# Patient Record
Sex: Female | Born: 1960 | Race: Black or African American | Hispanic: No | Marital: Single | Smoking: Never smoker
Health system: Southern US, Community
[De-identification: ages and names within clinical notes are randomized; demographics above are authoritative.]

## PROBLEM LIST (undated history)

## (undated) DIAGNOSIS — D869 Sarcoidosis, unspecified: Secondary | ICD-10-CM

## (undated) DIAGNOSIS — I4891 Unspecified atrial fibrillation: Secondary | ICD-10-CM

## (undated) DIAGNOSIS — G4733 Obstructive sleep apnea (adult) (pediatric): Secondary | ICD-10-CM

## (undated) HISTORY — PX: ABDOMINAL HYSTERECTOMY: SHX81

## (undated) HISTORY — PX: BREAST FIBROADENOMA SURGERY: SHX580

## (undated) HISTORY — DX: Obstructive sleep apnea (adult) (pediatric): G47.33

---

## 1999-10-04 ENCOUNTER — Encounter: Payer: Self-pay | Admitting: Family Medicine

## 1999-10-04 ENCOUNTER — Encounter: Admission: RE | Admit: 1999-10-04 | Discharge: 1999-10-04 | Payer: Self-pay | Admitting: Family Medicine

## 1999-10-05 ENCOUNTER — Encounter: Payer: Self-pay | Admitting: Family Medicine

## 1999-10-05 ENCOUNTER — Encounter: Admission: RE | Admit: 1999-10-05 | Discharge: 1999-10-05 | Payer: Self-pay | Admitting: Family Medicine

## 1999-12-01 ENCOUNTER — Ambulatory Visit: Admission: RE | Admit: 1999-12-01 | Discharge: 1999-12-01 | Payer: Self-pay | Admitting: Interventional Cardiology

## 1999-12-07 ENCOUNTER — Ambulatory Visit (HOSPITAL_COMMUNITY): Admission: RE | Admit: 1999-12-07 | Discharge: 1999-12-07 | Payer: Self-pay | Admitting: Pulmonary Disease

## 2000-03-15 ENCOUNTER — Other Ambulatory Visit: Admission: RE | Admit: 2000-03-15 | Discharge: 2000-03-15 | Payer: Self-pay | Admitting: Obstetrics and Gynecology

## 2000-05-23 ENCOUNTER — Inpatient Hospital Stay (HOSPITAL_COMMUNITY): Admission: AD | Admit: 2000-05-23 | Discharge: 2000-05-23 | Payer: Self-pay | Admitting: Obstetrics and Gynecology

## 2000-09-29 ENCOUNTER — Inpatient Hospital Stay (HOSPITAL_COMMUNITY): Admission: AD | Admit: 2000-09-29 | Discharge: 2000-10-02 | Payer: Self-pay | Admitting: Obstetrics and Gynecology

## 2000-09-29 ENCOUNTER — Inpatient Hospital Stay (HOSPITAL_COMMUNITY): Admission: AD | Admit: 2000-09-29 | Discharge: 2000-09-29 | Payer: Self-pay | Admitting: Obstetrics and Gynecology

## 2000-11-08 ENCOUNTER — Encounter: Admission: RE | Admit: 2000-11-08 | Discharge: 2001-02-06 | Payer: Self-pay | Admitting: Obstetrics and Gynecology

## 2000-12-21 ENCOUNTER — Other Ambulatory Visit: Admission: RE | Admit: 2000-12-21 | Discharge: 2000-12-21 | Payer: Self-pay | Admitting: Obstetrics and Gynecology

## 2000-12-21 ENCOUNTER — Encounter (INDEPENDENT_AMBULATORY_CARE_PROVIDER_SITE_OTHER): Payer: Self-pay

## 2001-05-28 ENCOUNTER — Other Ambulatory Visit: Admission: RE | Admit: 2001-05-28 | Discharge: 2001-05-28 | Payer: Self-pay | Admitting: Obstetrics and Gynecology

## 2001-11-13 ENCOUNTER — Other Ambulatory Visit: Admission: RE | Admit: 2001-11-13 | Discharge: 2001-11-13 | Payer: Self-pay | Admitting: Obstetrics and Gynecology

## 2003-01-03 ENCOUNTER — Other Ambulatory Visit: Admission: RE | Admit: 2003-01-03 | Discharge: 2003-01-03 | Payer: Self-pay | Admitting: Obstetrics and Gynecology

## 2004-01-30 ENCOUNTER — Other Ambulatory Visit: Admission: RE | Admit: 2004-01-30 | Discharge: 2004-01-30 | Payer: Self-pay | Admitting: Obstetrics and Gynecology

## 2005-02-24 ENCOUNTER — Encounter: Admission: RE | Admit: 2005-02-24 | Discharge: 2005-02-24 | Payer: Self-pay | Admitting: Family Medicine

## 2005-02-24 IMAGING — CT CT PELVIS W/O CM
1 series · 15 of 32 positions shown, 19 images · IV contrast (agent unspecified)
Comparison: none

CLINICAL DATA: Left flank pain.  History of renal calculi.  

ABDOMINAL CT ? NO CONTRAST:

[Series 2: renal stone · axial · 0.62mm/px · z∈[-364,-84]mm · 15 of 64 slices shown, 19 images]
[im 5/64  soft-tissue]
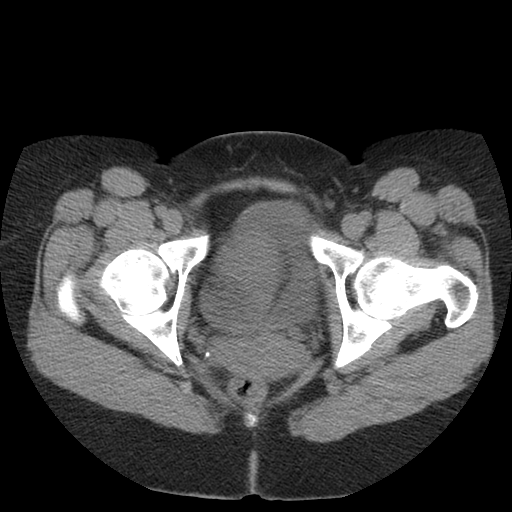
[im 5/64  bone]
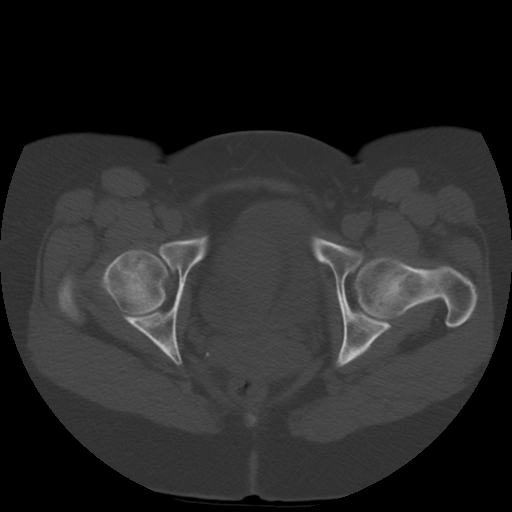
[im 9/64  soft-tissue]
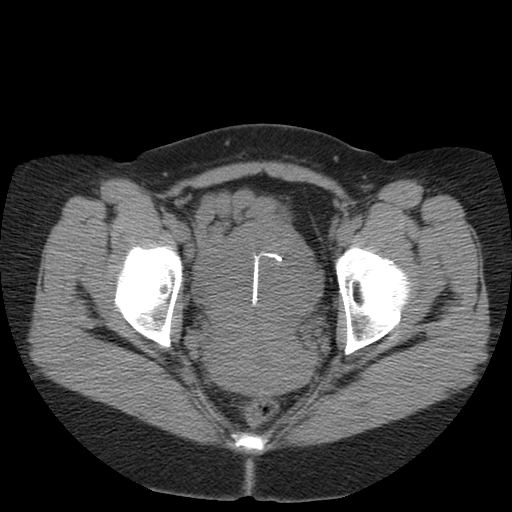
[im 13/64  soft-tissue]
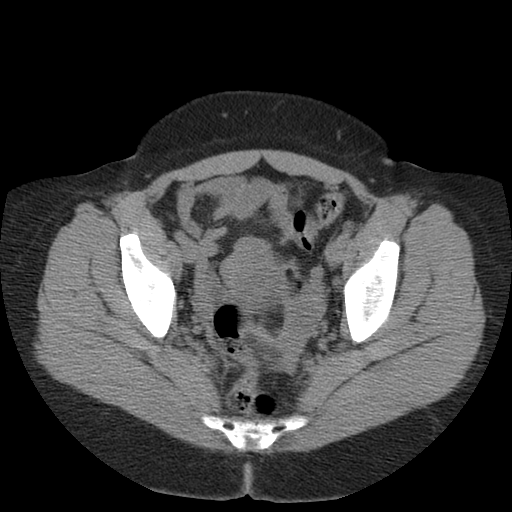
[im 19/64  soft-tissue]
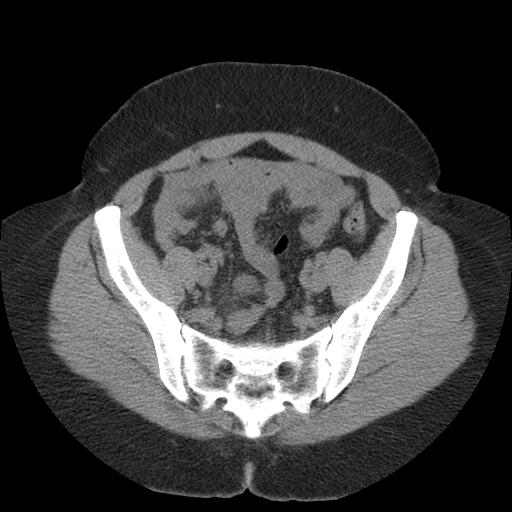
[im 23/64  soft-tissue]
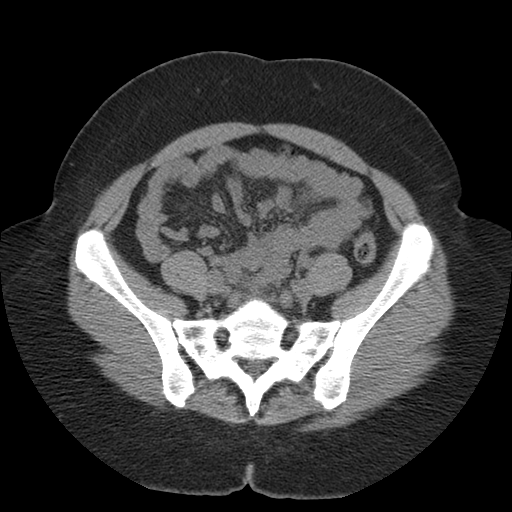
[im 27/64  soft-tissue]
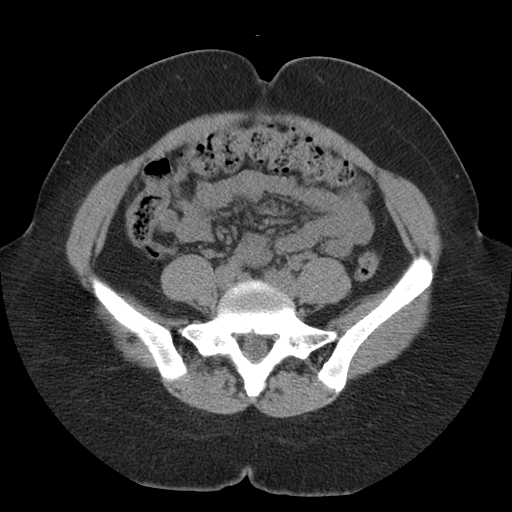
[im 33/64  soft-tissue]
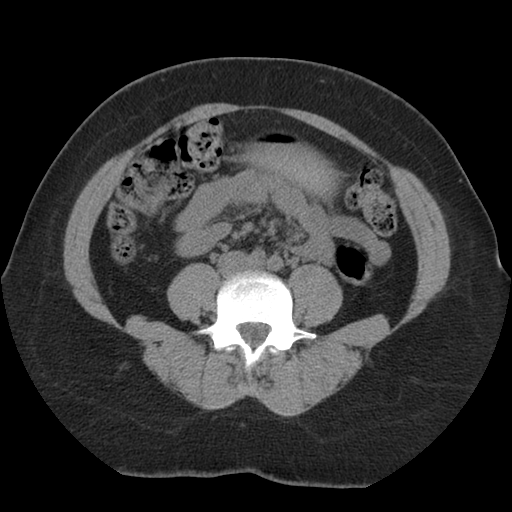
[im 37/64  soft-tissue]
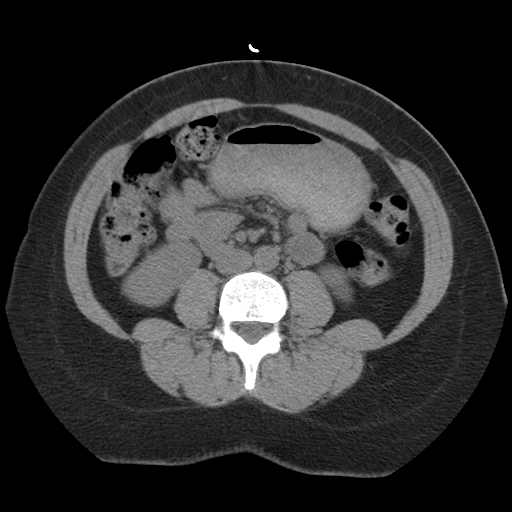
[im 41/64  soft-tissue]
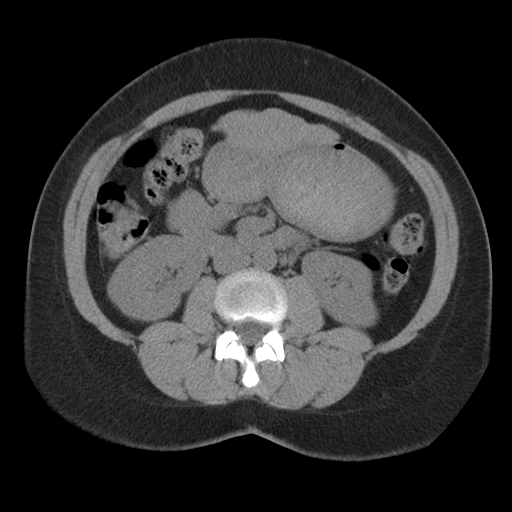
[im 41/64  bone]
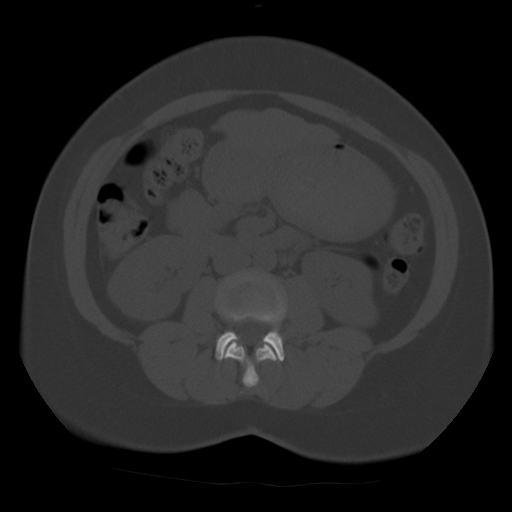
[im 45/64  soft-tissue]
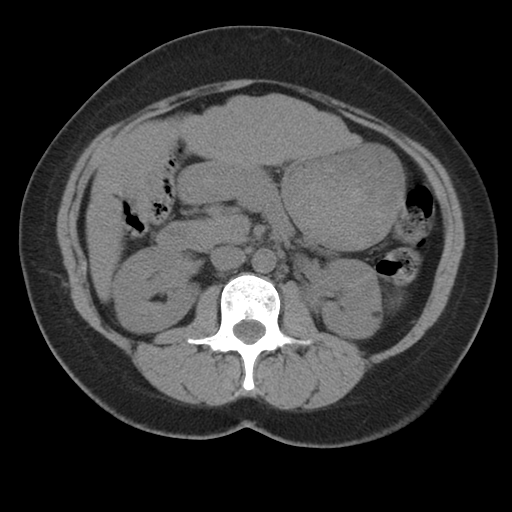
[im 51/64  soft-tissue]
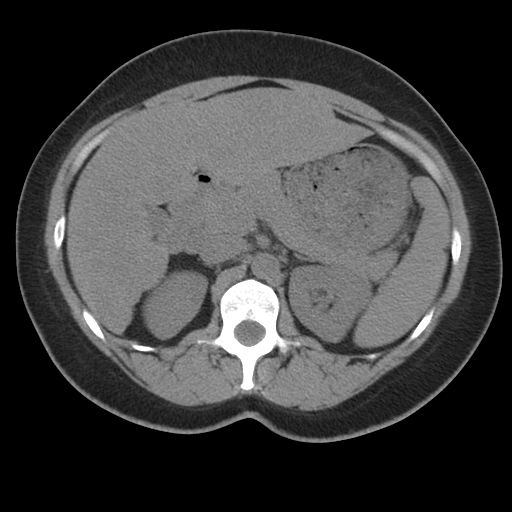
[im 55/64  soft-tissue]
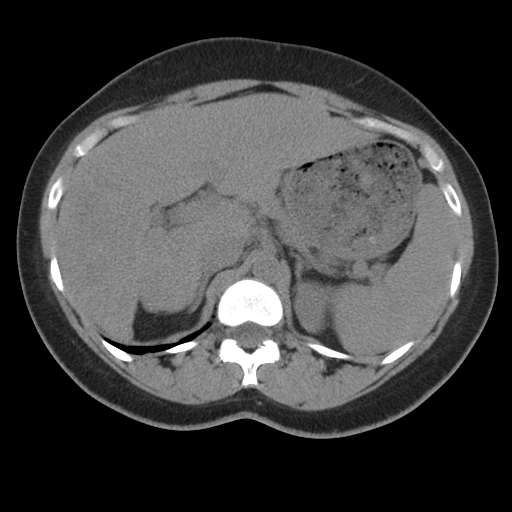
[im 55/64  lung]
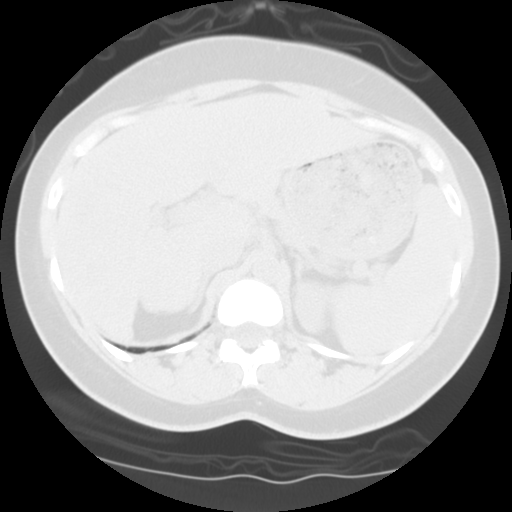
[im 57/64  lung]
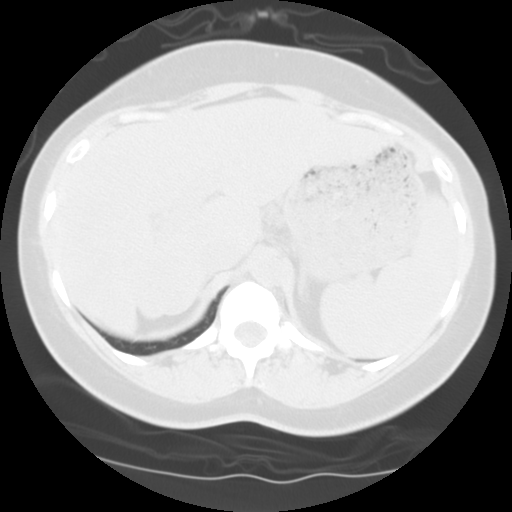
[im 59/64  soft-tissue]
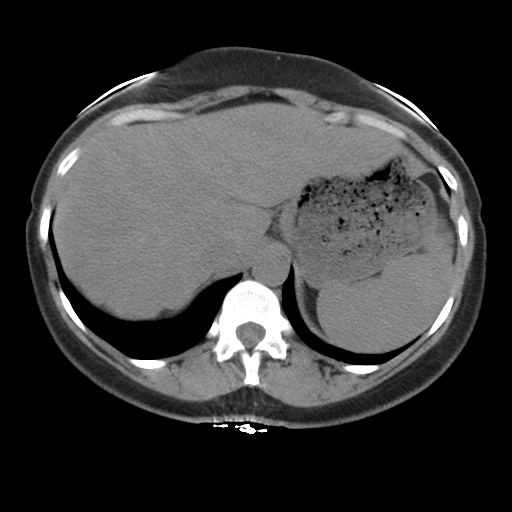
[im 59/64  lung]
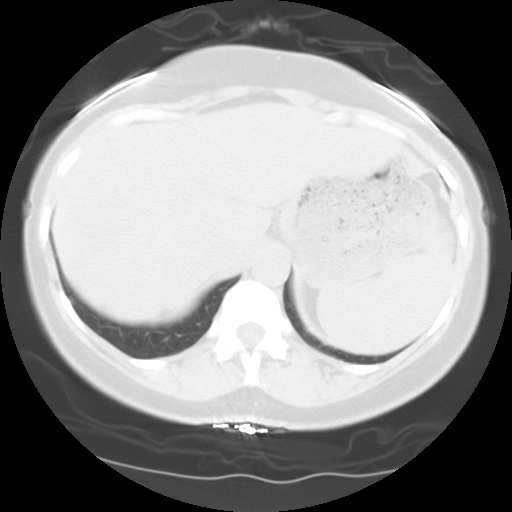
[im 61/64  lung]
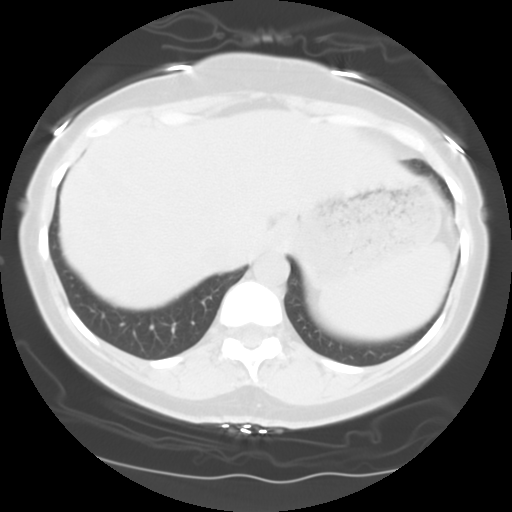

[15 of 32 positions shown; findings below may reference images not displayed]

FINDINGS: CT urogram technique with no oral nor IV contrast demonstrates lobulated contour to the liver with slightly heterogeneous density suggesting cirrhosis with slight splenomegaly consistent with portal venous hypertension ? need clinical correlation.  Lung bases are clear.  Moderate retained colonic feces without obstruction nor colitis is consistent with constipation.  Urinary tract is unremarkable with no obstructing calculi.   A 6 mm likely incidental angiomyolipoma is seen at the mid to lower pole left kidney (image 20).
IMPRESSION: 1.  Probable liver cirrhosis/slight splenomegaly (portal venous hypertension) ? need clinical correlation.  

2.  Constipation.

3.  ncidental 6 mm angiomyolipoma left kidney.  

3.  Otherwise negative. 

PELVIS CT - NO CONTRAST:

Non-contrast spiral CT of the pelvis was performed. 

There is no evidence of urinary tract calculi or ureterectasis. The other pelvic structures are unremarkable. There is no evidence of masses, inflammatory process, or abnormal fluid collections.  Uterus is upper limits of normal in size with central intrauterine device in satisfactory position.
IMPRESSION: 1.  Intrauterine device in satisfactory position with uterus upper limits of normal in size. 

2.  Otherwise negative.

## 2005-03-08 ENCOUNTER — Other Ambulatory Visit: Admission: RE | Admit: 2005-03-08 | Discharge: 2005-03-08 | Payer: Self-pay | Admitting: Obstetrics and Gynecology

## 2005-03-11 ENCOUNTER — Encounter (INDEPENDENT_AMBULATORY_CARE_PROVIDER_SITE_OTHER): Payer: Self-pay | Admitting: Specialist

## 2005-03-11 ENCOUNTER — Encounter: Admission: RE | Admit: 2005-03-11 | Discharge: 2005-03-11 | Payer: Self-pay | Admitting: Obstetrics and Gynecology

## 2005-03-19 ENCOUNTER — Emergency Department (HOSPITAL_COMMUNITY): Admission: EM | Admit: 2005-03-19 | Discharge: 2005-03-19 | Payer: Self-pay | Admitting: Emergency Medicine
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
## 2005-03-20 ENCOUNTER — Emergency Department (HOSPITAL_COMMUNITY): Admission: EM | Admit: 2005-03-20 | Discharge: 2005-03-20 | Payer: Self-pay | Admitting: Emergency Medicine
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
## 2005-03-23 ENCOUNTER — Ambulatory Visit (HOSPITAL_COMMUNITY): Admission: RE | Admit: 2005-03-23 | Discharge: 2005-03-23 | Payer: Self-pay | Admitting: Gastroenterology

## 2005-04-14 ENCOUNTER — Encounter: Admission: RE | Admit: 2005-04-14 | Discharge: 2005-04-14 | Payer: Self-pay | Admitting: Surgery

## 2005-04-14 IMAGING — CR DG CHEST 2V
3 series · 3 of 3 positions shown · non-contrast
Comparison: none

CLINICAL DATA: History or sarcoidosis.  Pre op for breast surgery.
 CHEST ? TWO VIEWS:
 Two views of the chest show linear scarring in the right upper lobe.  No active infiltrate or effusion is seen.  The heart is within normal limits in size.

[w chest pa]
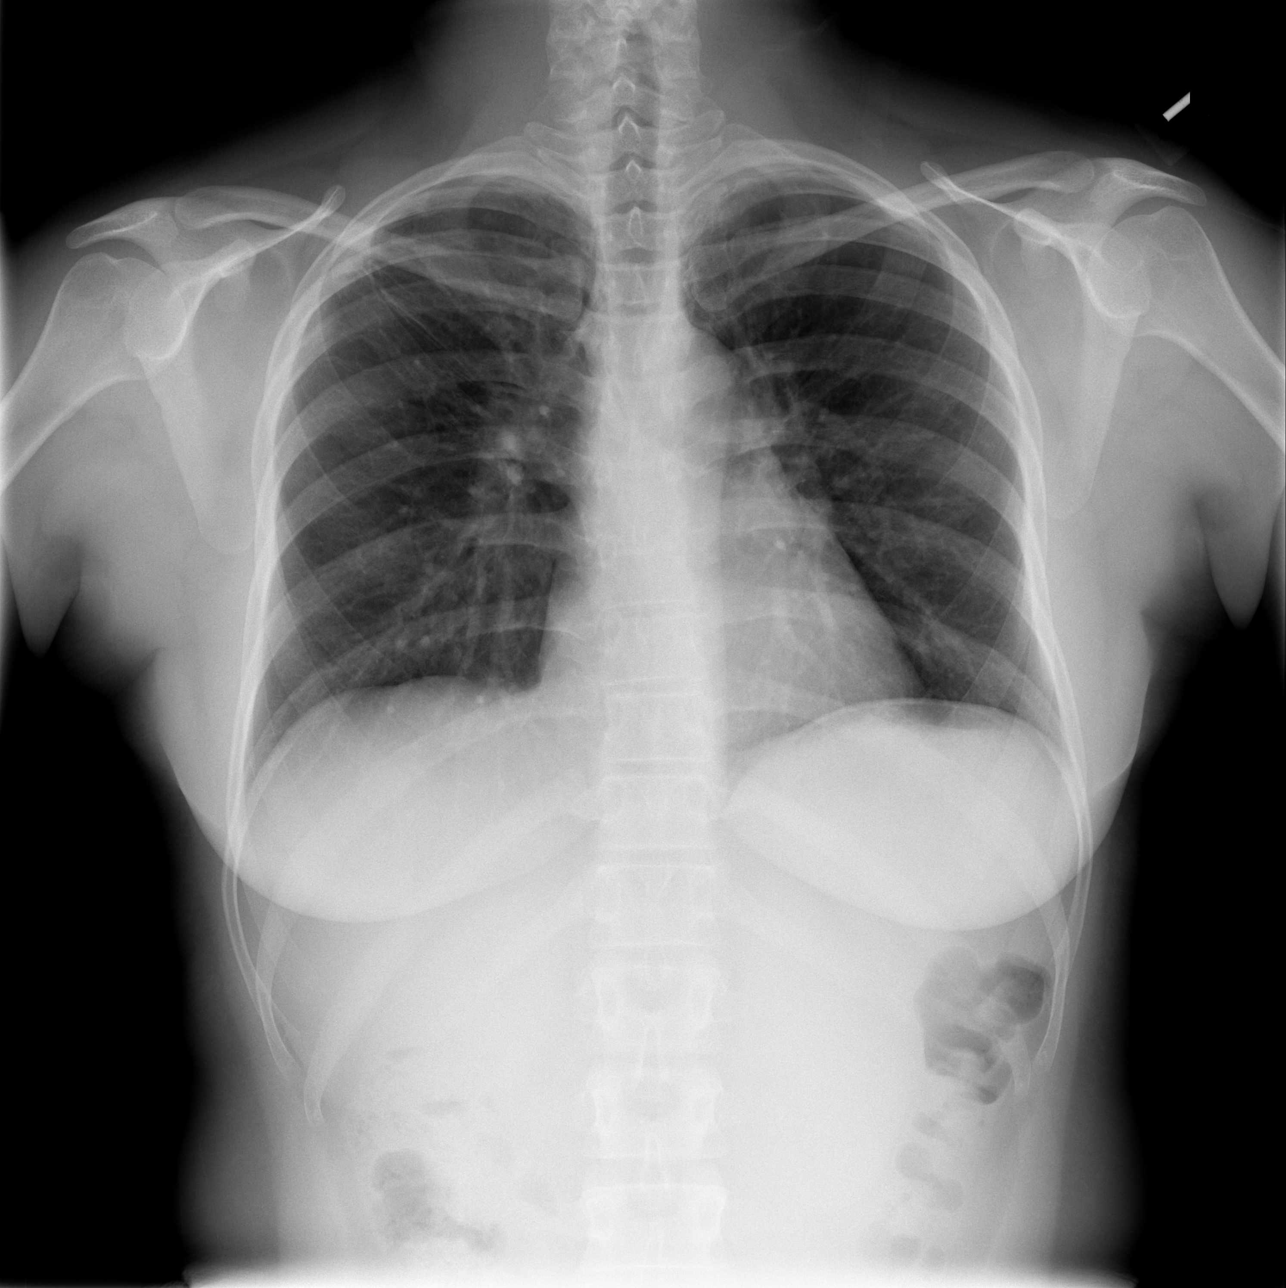

[w chest lat (1 of 2)]
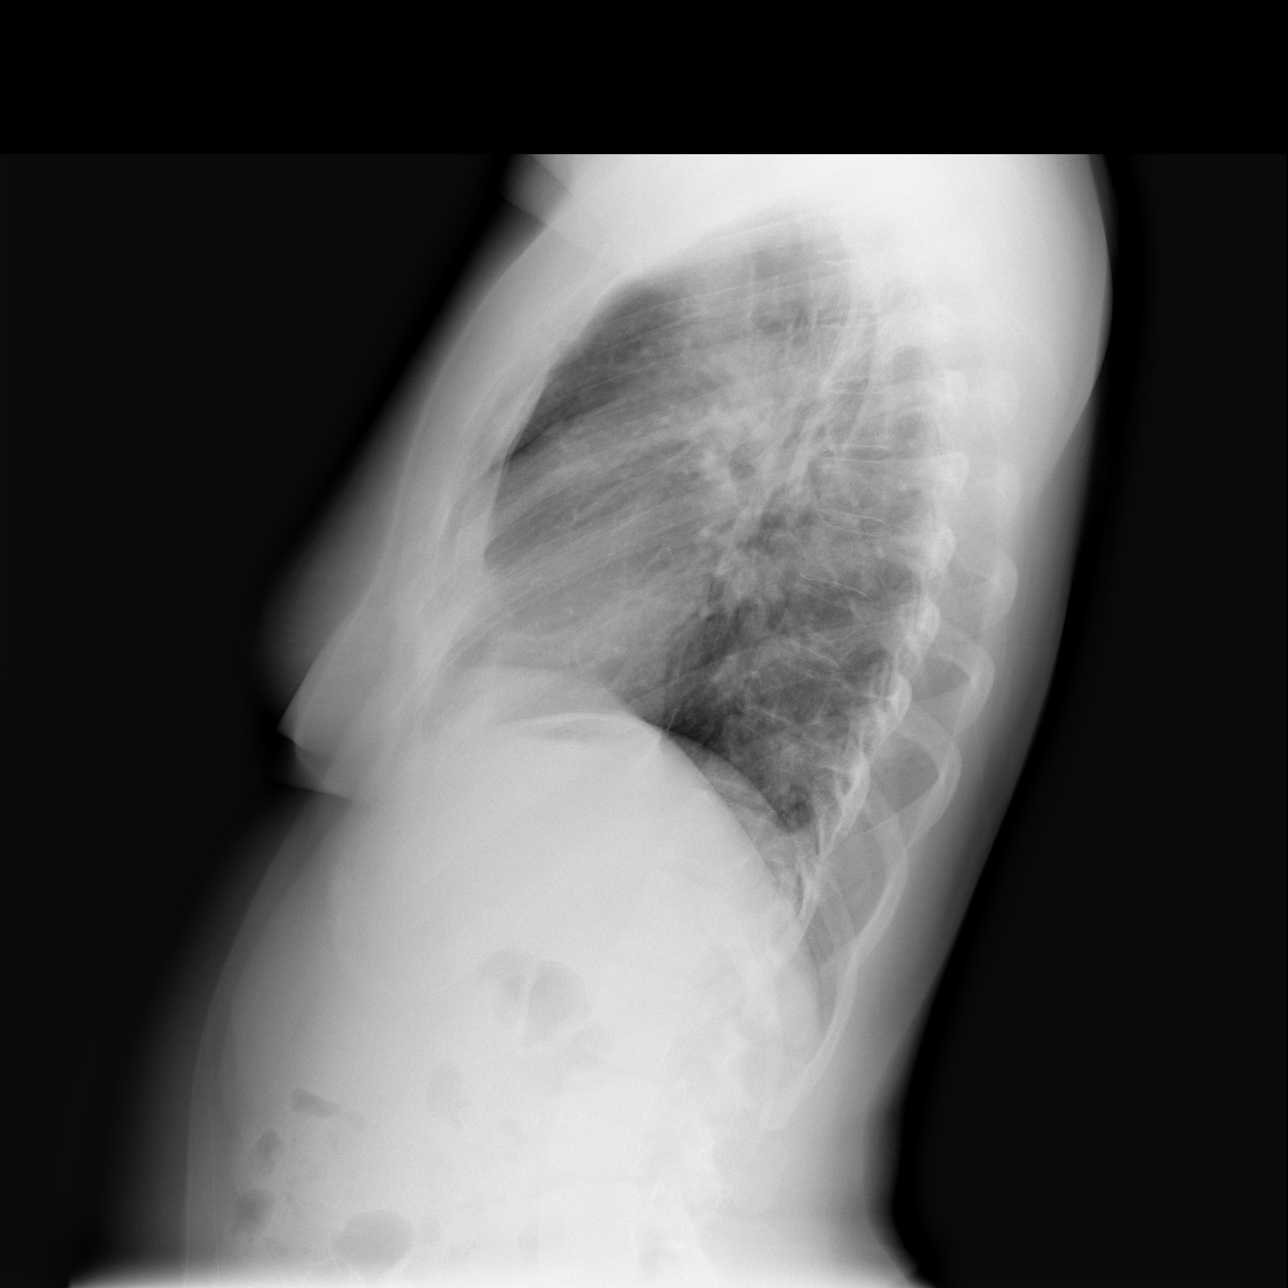

[w chest lat (2 of 2)]
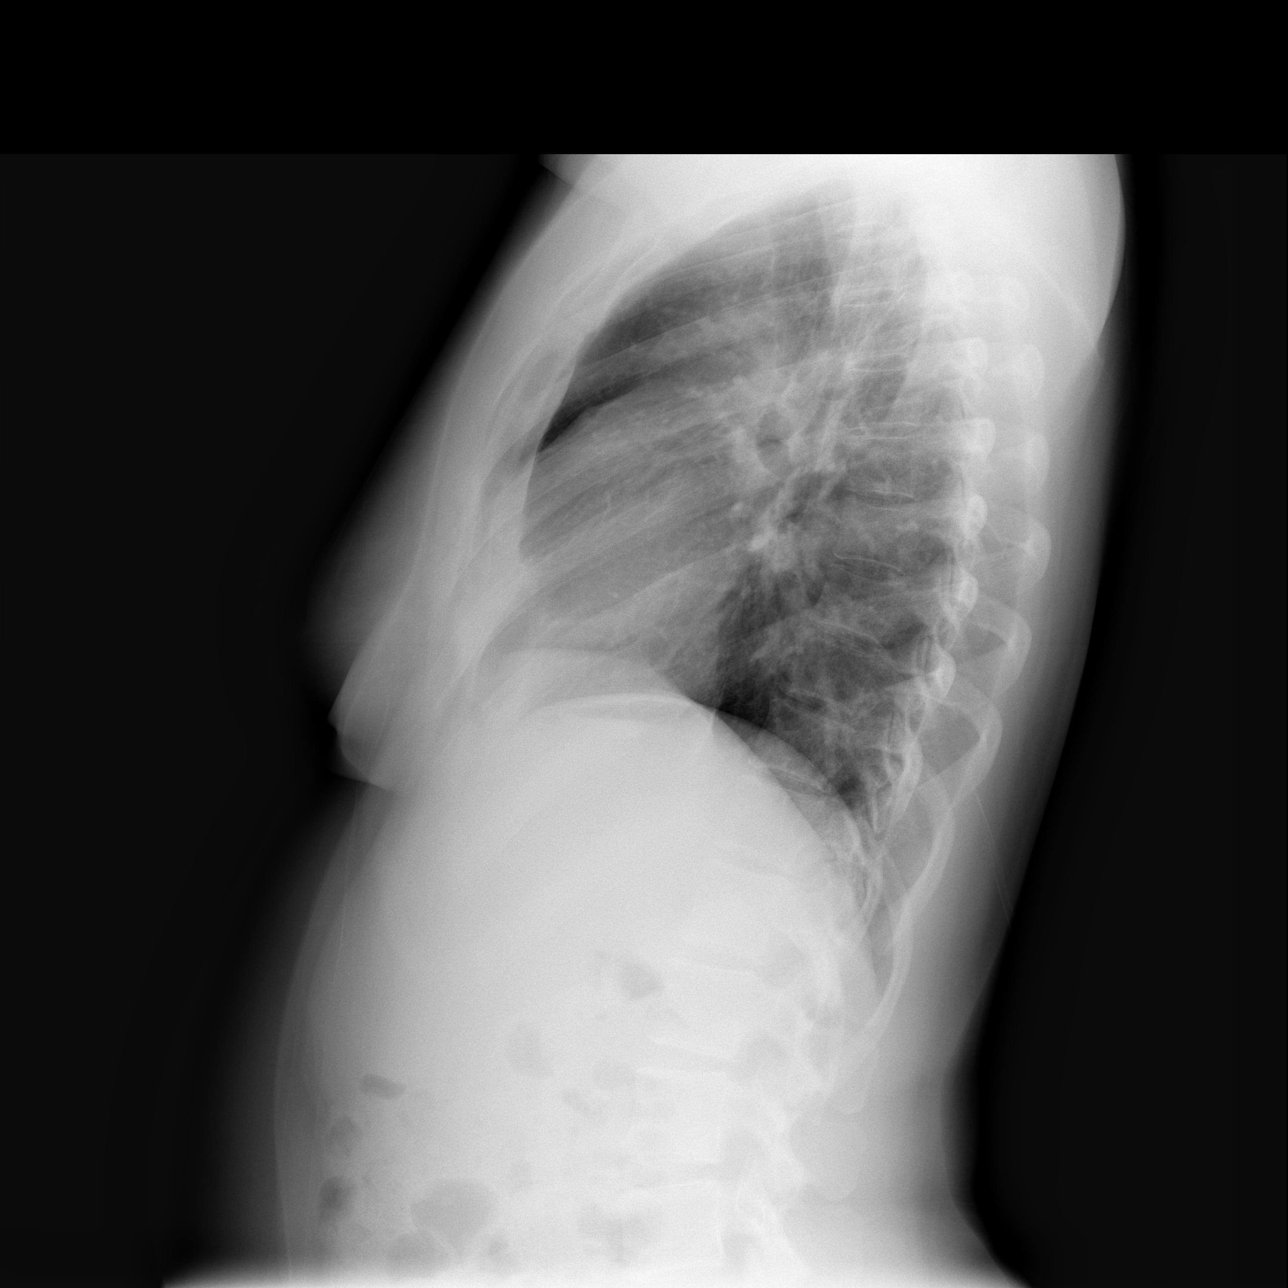

[3 of 3 positions shown; findings below may reference images not displayed]

IMPRESSION: Scarring in the right upper lobe.  No active lung disease.

## 2005-04-15 ENCOUNTER — Ambulatory Visit (HOSPITAL_COMMUNITY): Admission: RE | Admit: 2005-04-15 | Discharge: 2005-04-15 | Payer: Self-pay | Admitting: Surgery

## 2005-04-15 ENCOUNTER — Ambulatory Visit (HOSPITAL_BASED_OUTPATIENT_CLINIC_OR_DEPARTMENT_OTHER): Admission: RE | Admit: 2005-04-15 | Discharge: 2005-04-15 | Payer: Self-pay | Admitting: Surgery

## 2005-04-15 ENCOUNTER — Encounter (INDEPENDENT_AMBULATORY_CARE_PROVIDER_SITE_OTHER): Payer: Self-pay | Admitting: *Deleted
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
## 2005-06-24 ENCOUNTER — Ambulatory Visit: Payer: Self-pay | Admitting: Pulmonary Disease

## 2005-07-15 ENCOUNTER — Ambulatory Visit (HOSPITAL_BASED_OUTPATIENT_CLINIC_OR_DEPARTMENT_OTHER): Admission: RE | Admit: 2005-07-15 | Discharge: 2005-07-15 | Payer: Self-pay | Admitting: Pulmonary Disease

## 2005-07-27 ENCOUNTER — Ambulatory Visit: Payer: Self-pay | Admitting: Pulmonary Disease

## 2005-08-06 ENCOUNTER — Emergency Department (HOSPITAL_COMMUNITY): Admission: EM | Admit: 2005-08-06 | Discharge: 2005-08-06 | Payer: Self-pay | Admitting: Emergency Medicine

## 2005-08-06 IMAGING — CR DG CHEST 2V
2 series · 2 of 2 positions shown · non-contrast
Comparison: [DATE].

CLINICAL DATA: Chest pain. 
 CHEST - 2 VIEW:

[w chest pa]
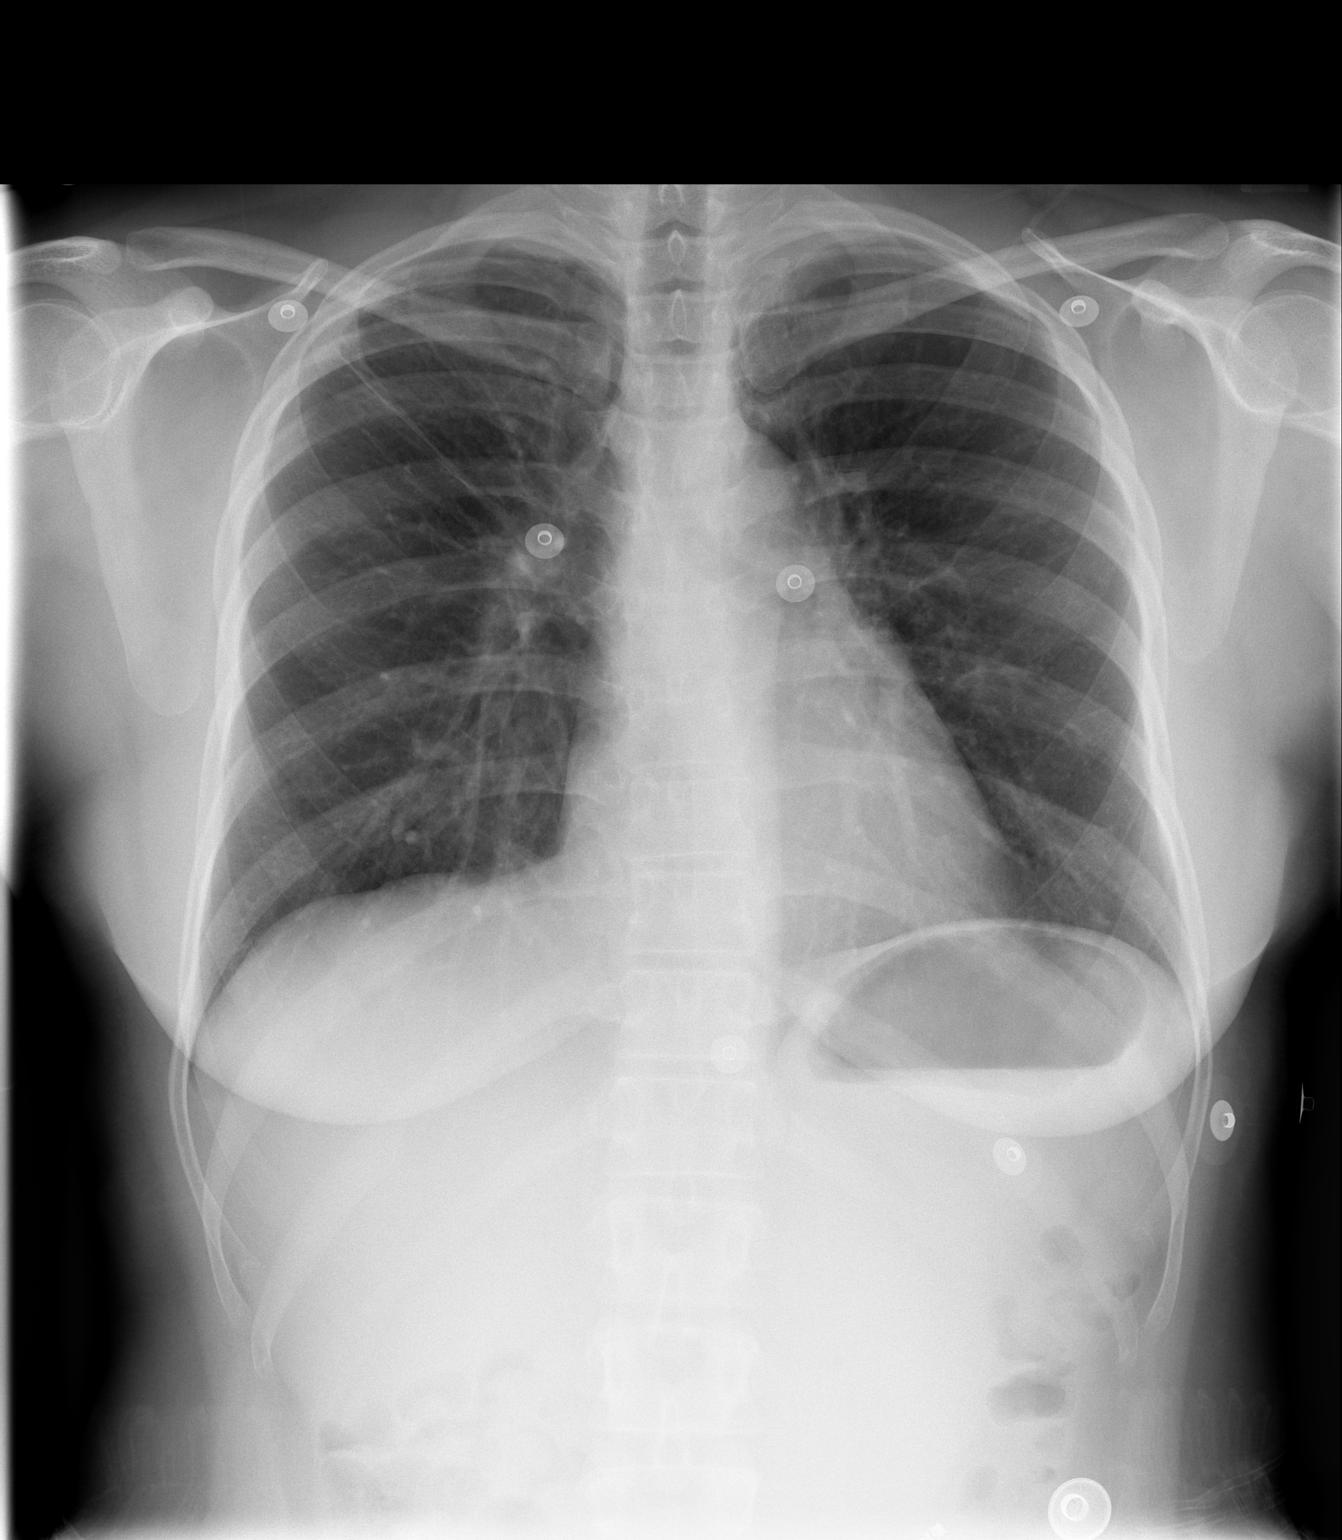

[w chest lat]
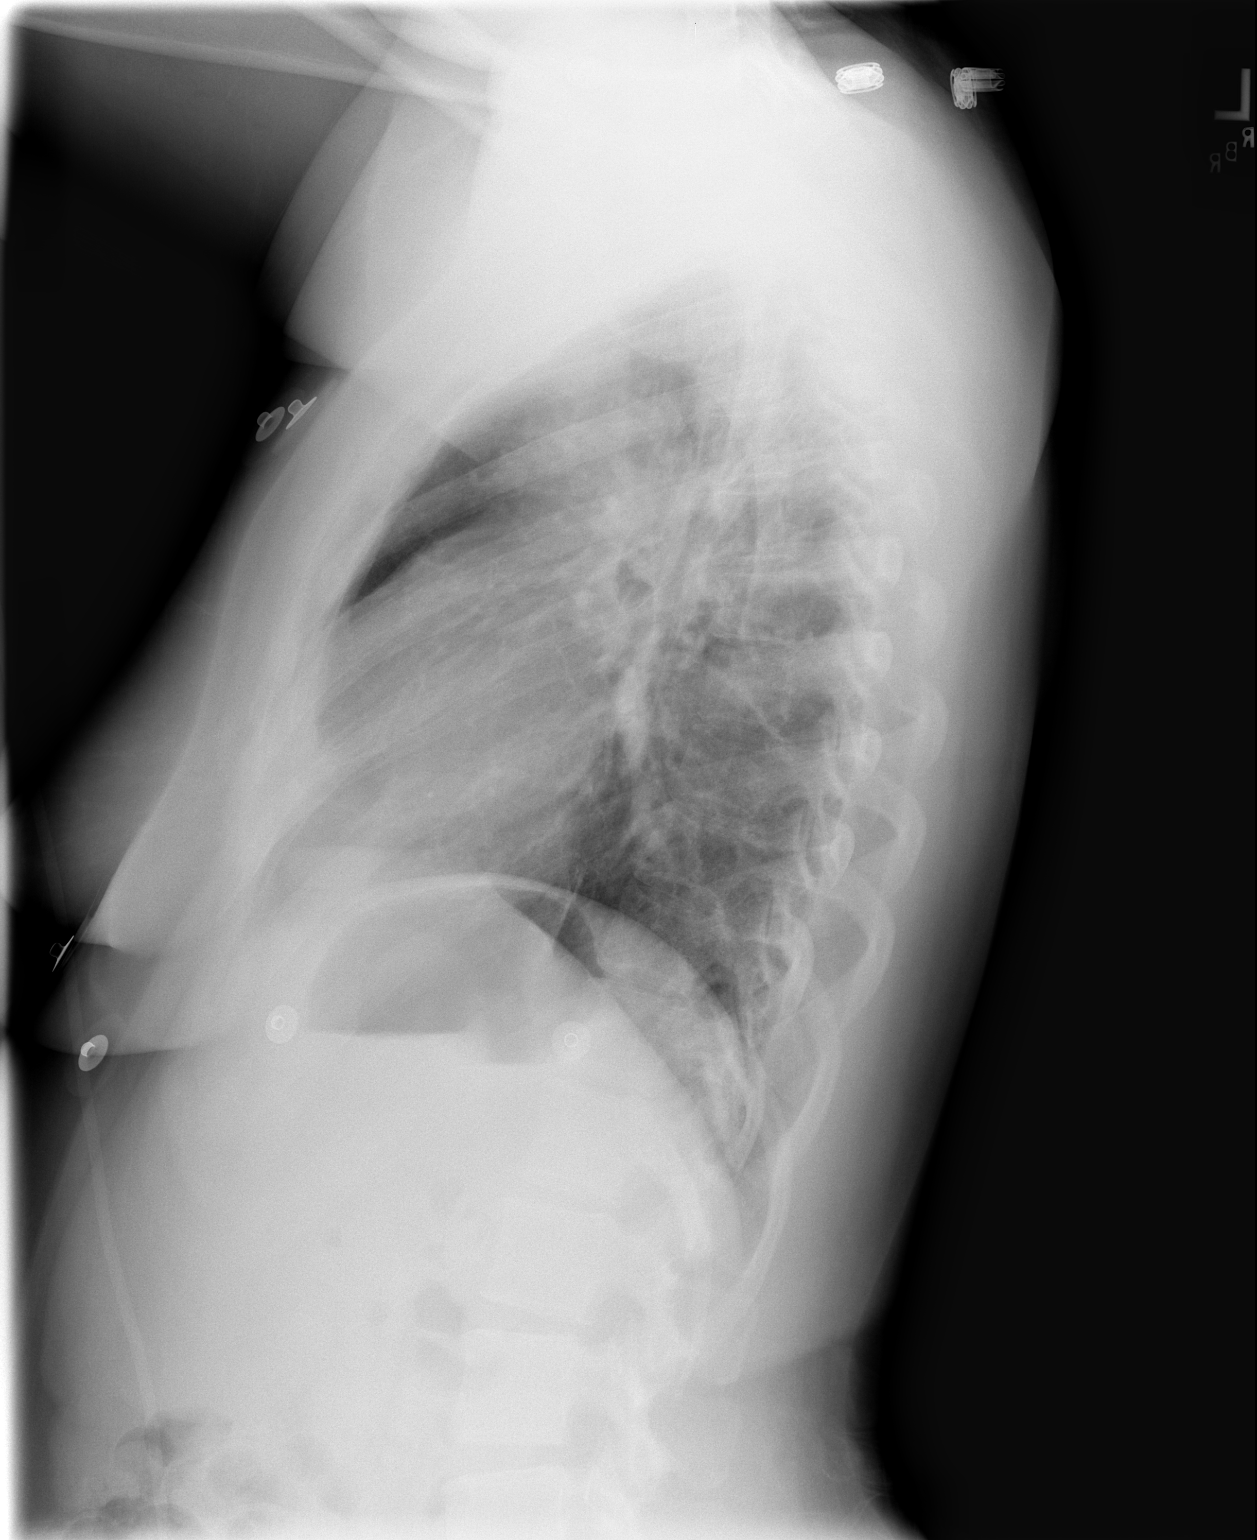

[2 of 2 positions shown; findings below may reference images not displayed]

FINDINGS: The heart and mediastinum are normal.  Stable right upper lobe scarring.  No new findings.  Osseous structures are intact.
IMPRESSION: Chronic right upper lobe scar ? No active disease.

## 2005-08-08 ENCOUNTER — Encounter: Admission: RE | Admit: 2005-08-08 | Discharge: 2005-10-05 | Payer: Self-pay | Admitting: Orthopedic Surgery

## 2005-09-05 ENCOUNTER — Ambulatory Visit: Payer: Self-pay | Admitting: Pulmonary Disease

## 2005-12-07 ENCOUNTER — Ambulatory Visit: Payer: Self-pay | Admitting: Pulmonary Disease

## 2006-01-17 ENCOUNTER — Other Ambulatory Visit: Admission: RE | Admit: 2006-01-17 | Discharge: 2006-01-17 | Payer: Self-pay | Admitting: Family Medicine

## 2006-03-03 ENCOUNTER — Ambulatory Visit: Payer: Self-pay | Admitting: Pulmonary Disease

## 2006-07-24 ENCOUNTER — Ambulatory Visit: Payer: Self-pay | Admitting: Pulmonary Disease

## 2006-08-07 ENCOUNTER — Other Ambulatory Visit: Admission: RE | Admit: 2006-08-07 | Discharge: 2006-08-07 | Payer: Self-pay | Admitting: Obstetrics and Gynecology

## 2006-08-25 ENCOUNTER — Ambulatory Visit (HOSPITAL_COMMUNITY): Admission: RE | Admit: 2006-08-25 | Discharge: 2006-08-25 | Payer: Self-pay | Admitting: Obstetrics and Gynecology

## 2006-08-25 IMAGING — US US PELVIS COMPLETE MODIFY
1 series · 18 of 25 positions shown · non-contrast
Comparison: The uterus is heterogeneous and lobular.

[DATE] ? DUPLICATE COPY for exam association in [facility] ? No change from original report.
CLINICAL DATA: Pelvic pain and vaginal spotting.
 TRANSABDOMINAL AND TRANSVAGINAL PELVIC ULTRASOUND:
TECHNIQUE: Both transabdominal and transvaginal ultrasound examinations of the pelvis were performed including evaluation of the uterus, ovaries, adnexal regions, and pelvic cul-de-sac.

[Series 1: us transvaginal non-ob · 18 of 66 slices shown]
[im 1/66]
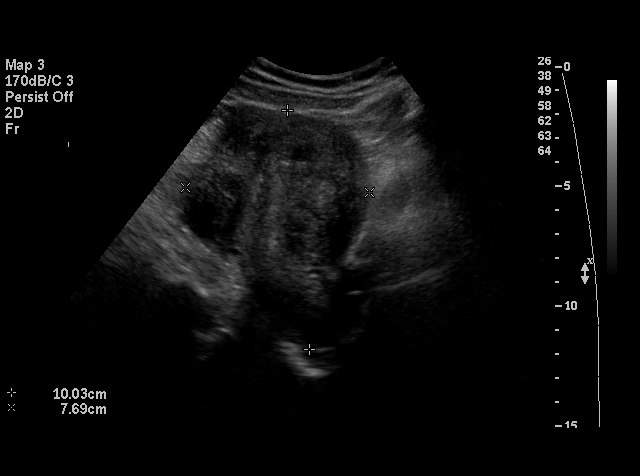
[im 6/66]
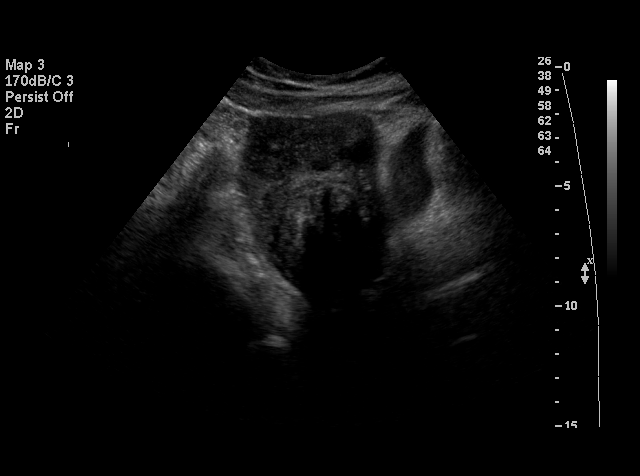
[im 9/66]
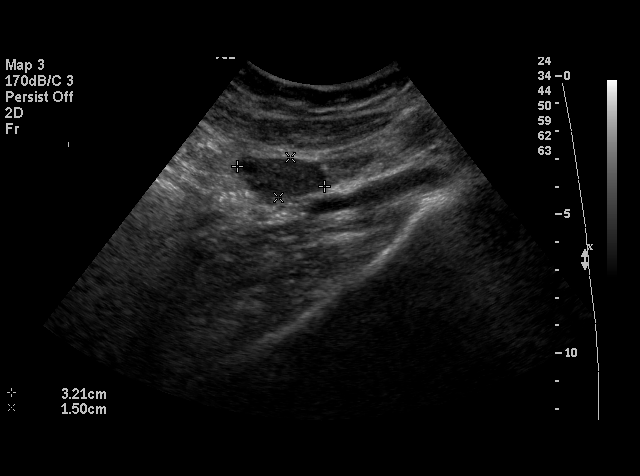
[im 11/66]
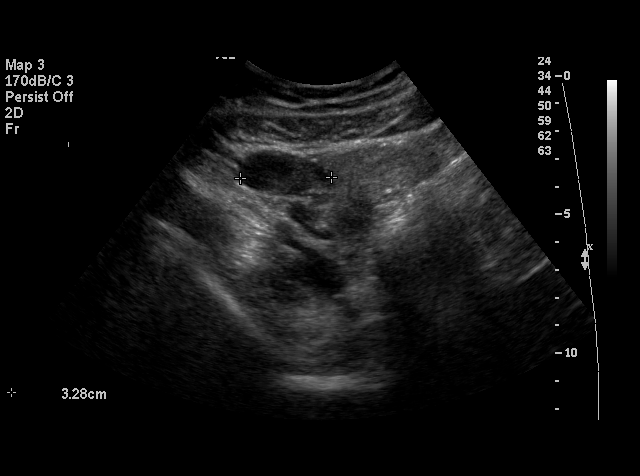
[im 17/66]
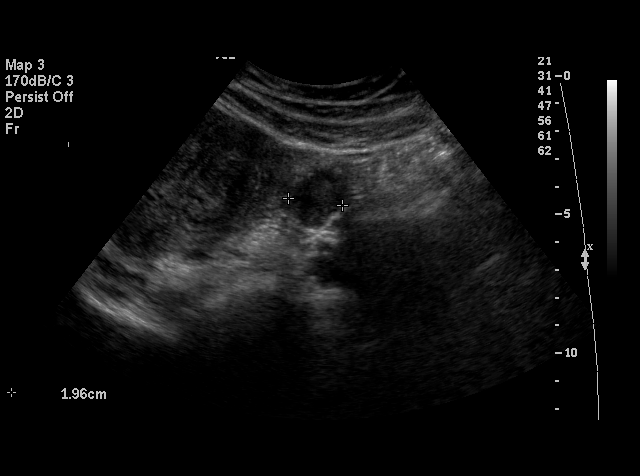
[im 19/66]
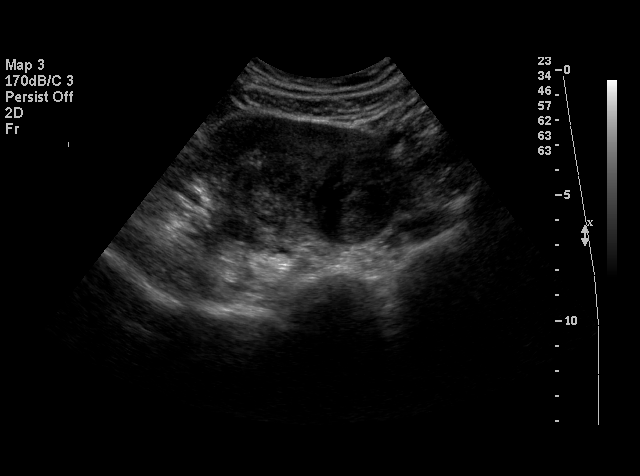
[im 25/66]
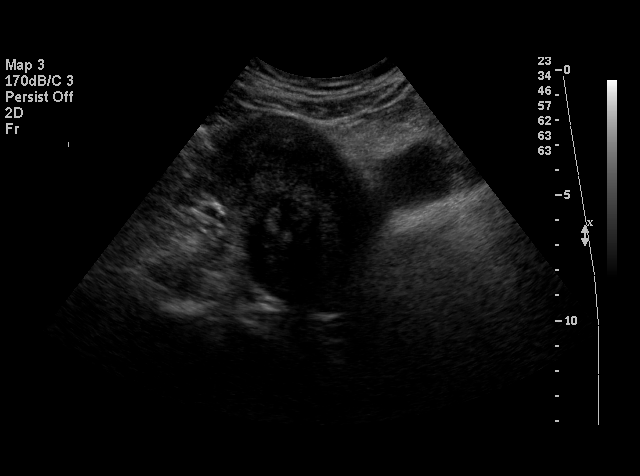
[im 28/66]
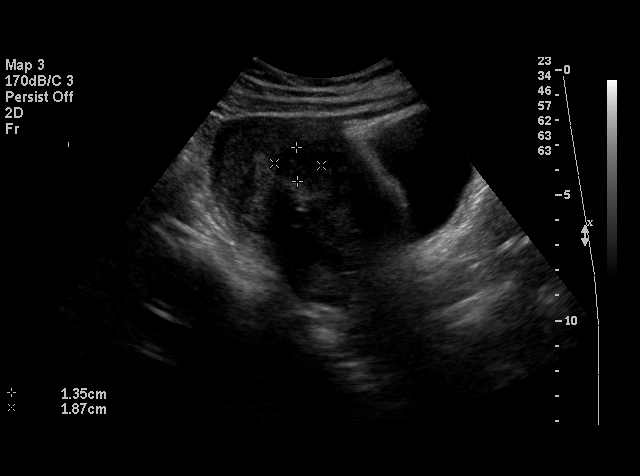
[im 30/66]
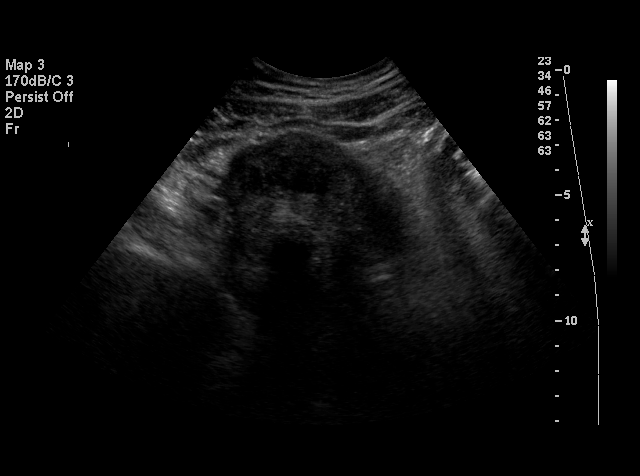
[im 36/66]
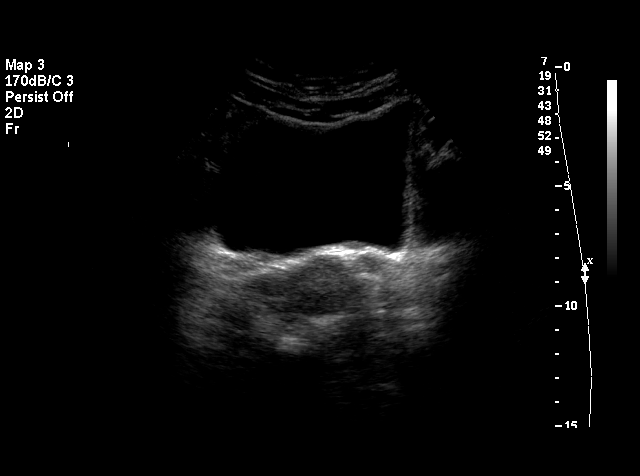
[im 38/66]
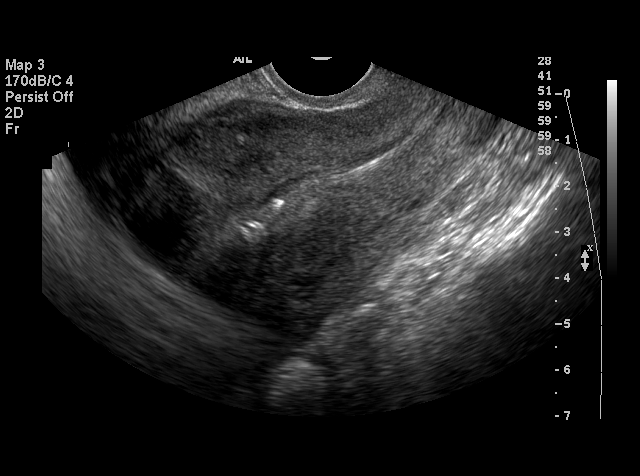
[im 41/66]
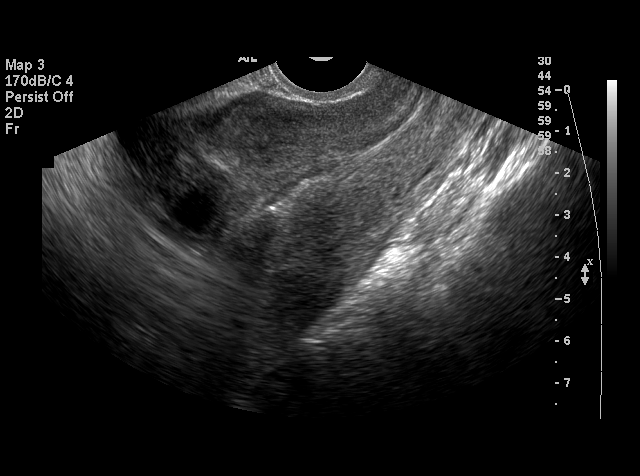
[im 47/66]
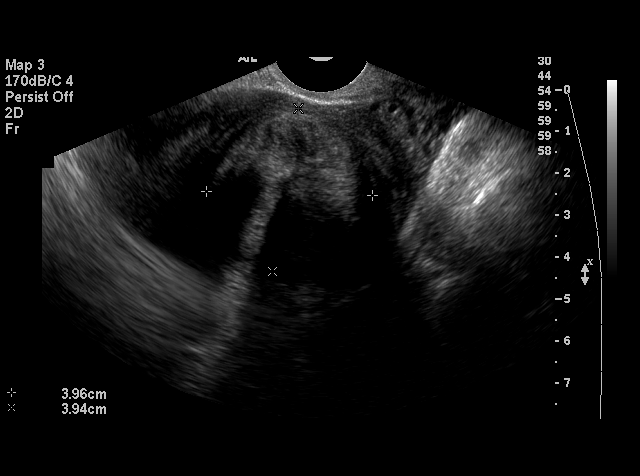
[im 49/66]
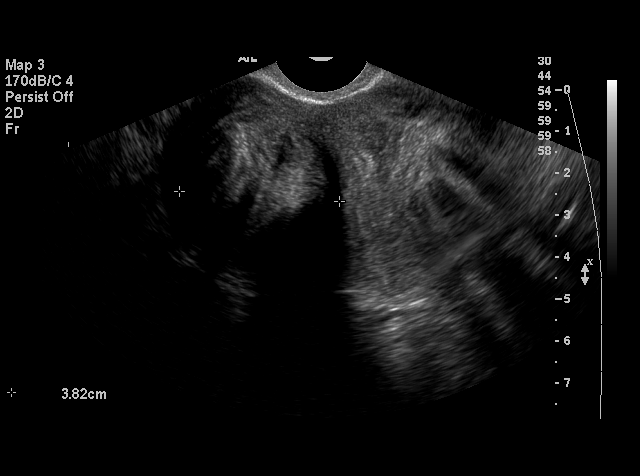
[im 55/66]
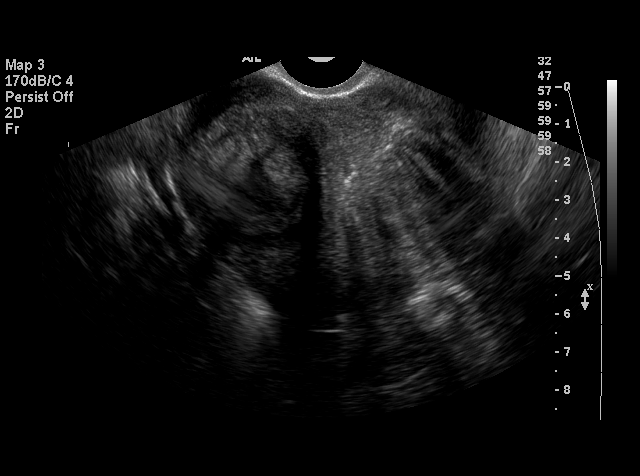
[im 57/66]
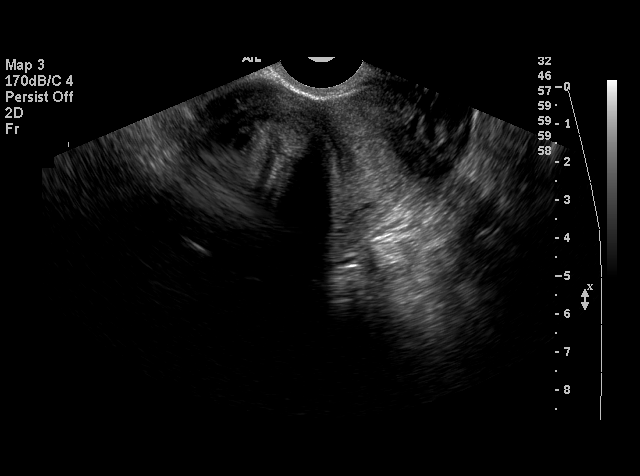
[im 60/66]
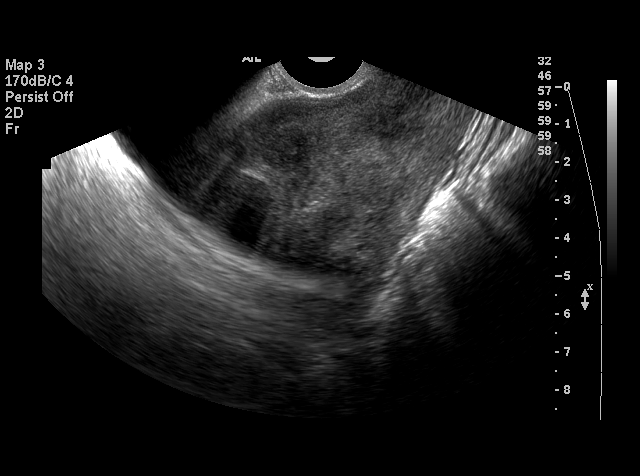
[im 66/66]
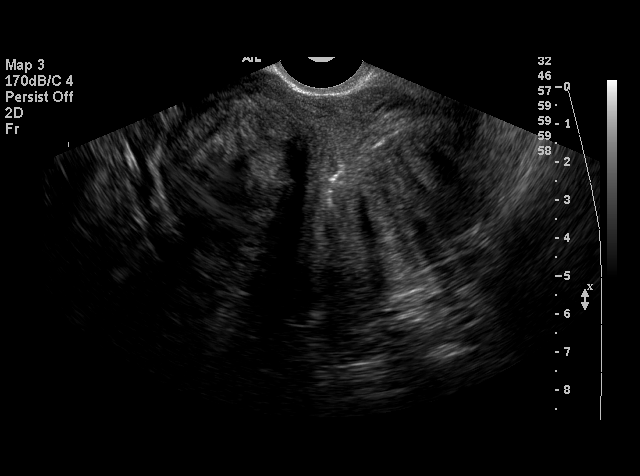

[18 of 25 positions shown; findings below may reference images not displayed]

The uterine dimensions are 10.0 x 7.7 x 8.7 cm.  An echogenic IUD is in place and in good position.  The endometrium is not thickened, measuring approximately 4-5 mm in width.  Multiple uterine fibroids are present.  The largest of these is located at the mid segment to the right of midline, measuring 4.0 cm and at the mid segment to the left of midline, measuring 4.2 cm.  It is difficult to tell if these are myometrial or submucosal in location.  
 Both ovaries have a normal size, shape, and echo texture.  The right ovary measures 3.2 x 1.5 x 3.3 cm, and the left ovary measures 3.5 x 1.4 x 2.0 cm.  No free pelvic fluid is present.
IMPRESSION: Fibroid uterus.

## 2006-12-27 ENCOUNTER — Encounter: Admission: RE | Admit: 2006-12-27 | Discharge: 2006-12-27 | Payer: Self-pay | Admitting: Family Medicine

## 2006-12-27 IMAGING — MG MM DIGITAL SCREENING BILAT W/ CAD
4 series · 4 of 4 positions shown · non-contrast
Comparison: none

DG SCREEN MAMMOGRAM BILATERAL
Bilateral CC and MLO view(s) were taken.

DIGITAL SCREENING MAMMOGRAM WITH CAD:
There is a fibroglandular pattern.  No masses or malignant type calcifications are identified.  
Compared with prior studies.

[R CC]
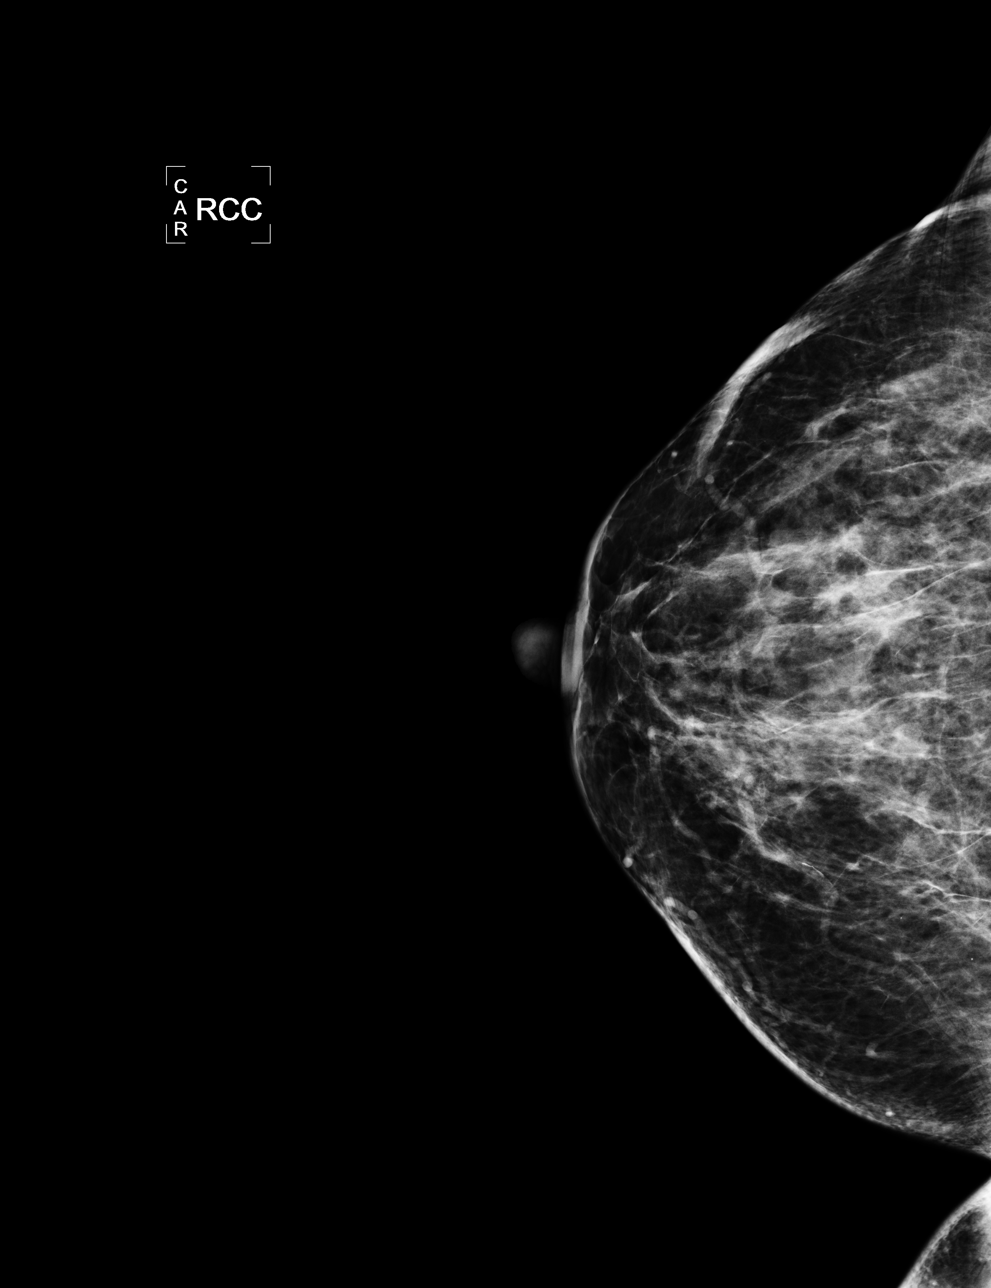

[L CC]
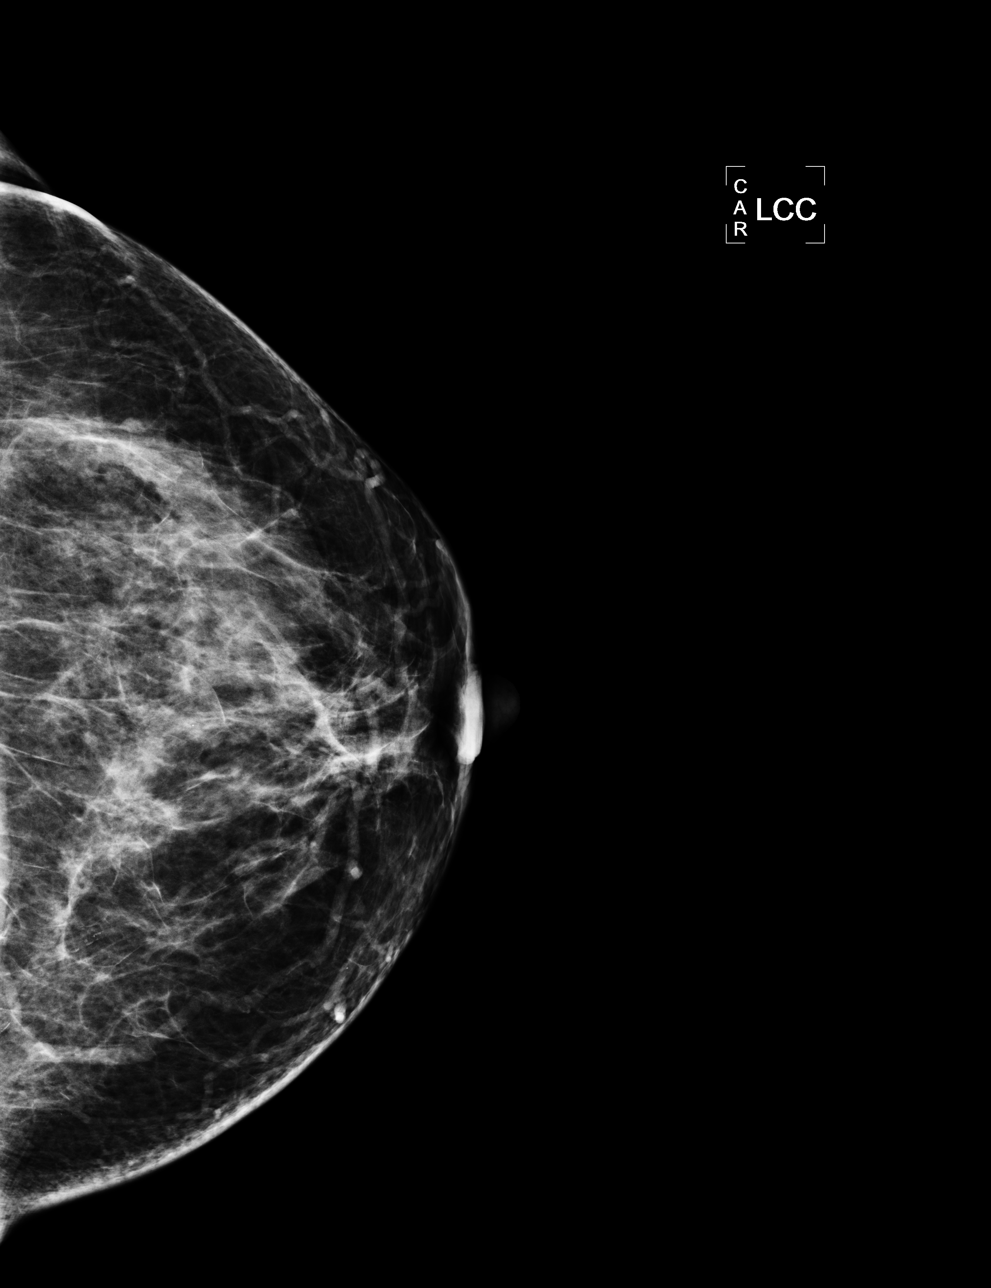

[L MLO]
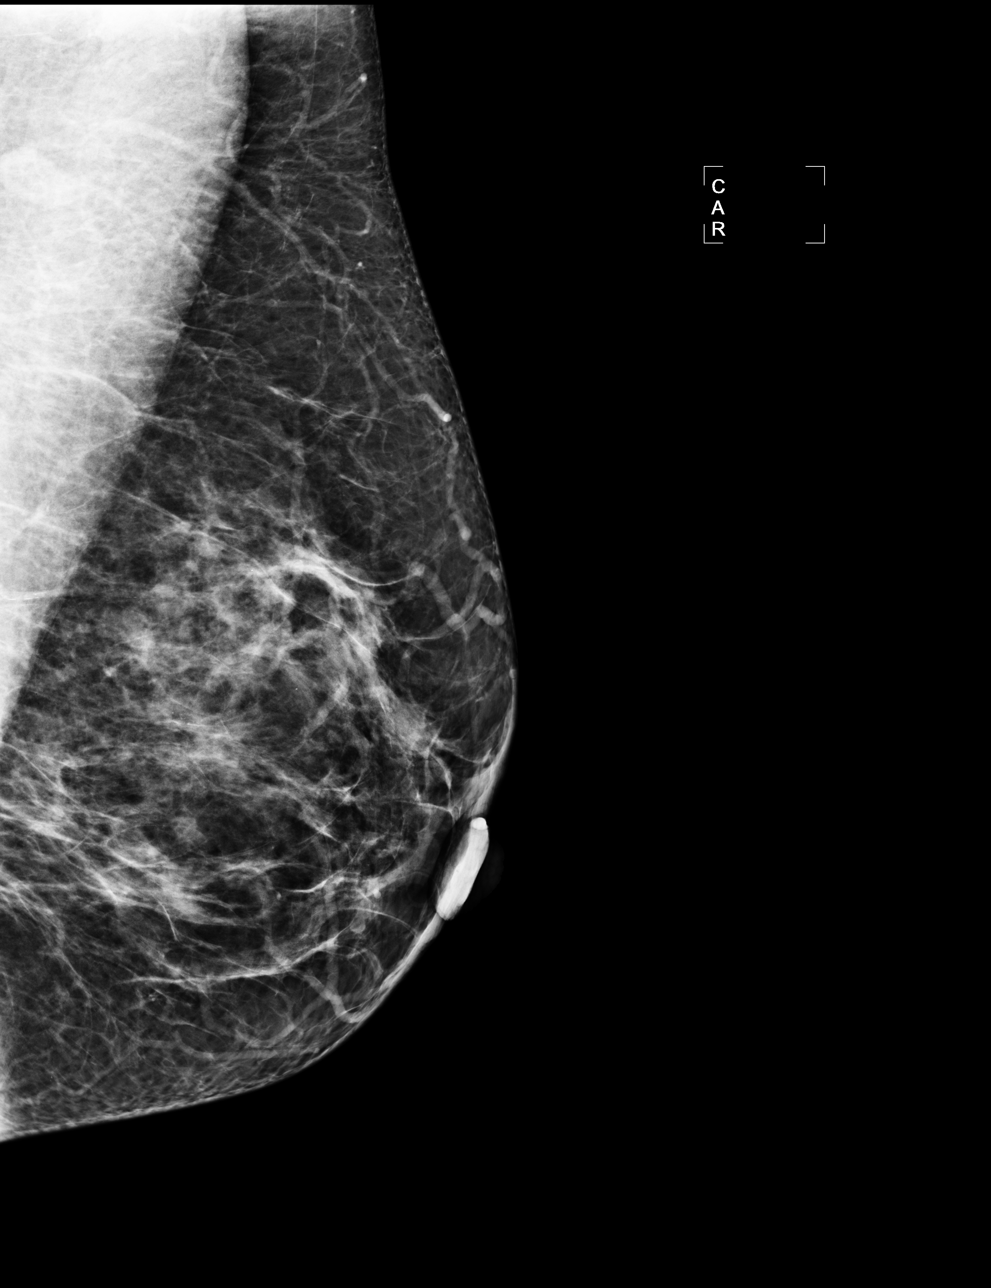

[R MLO]
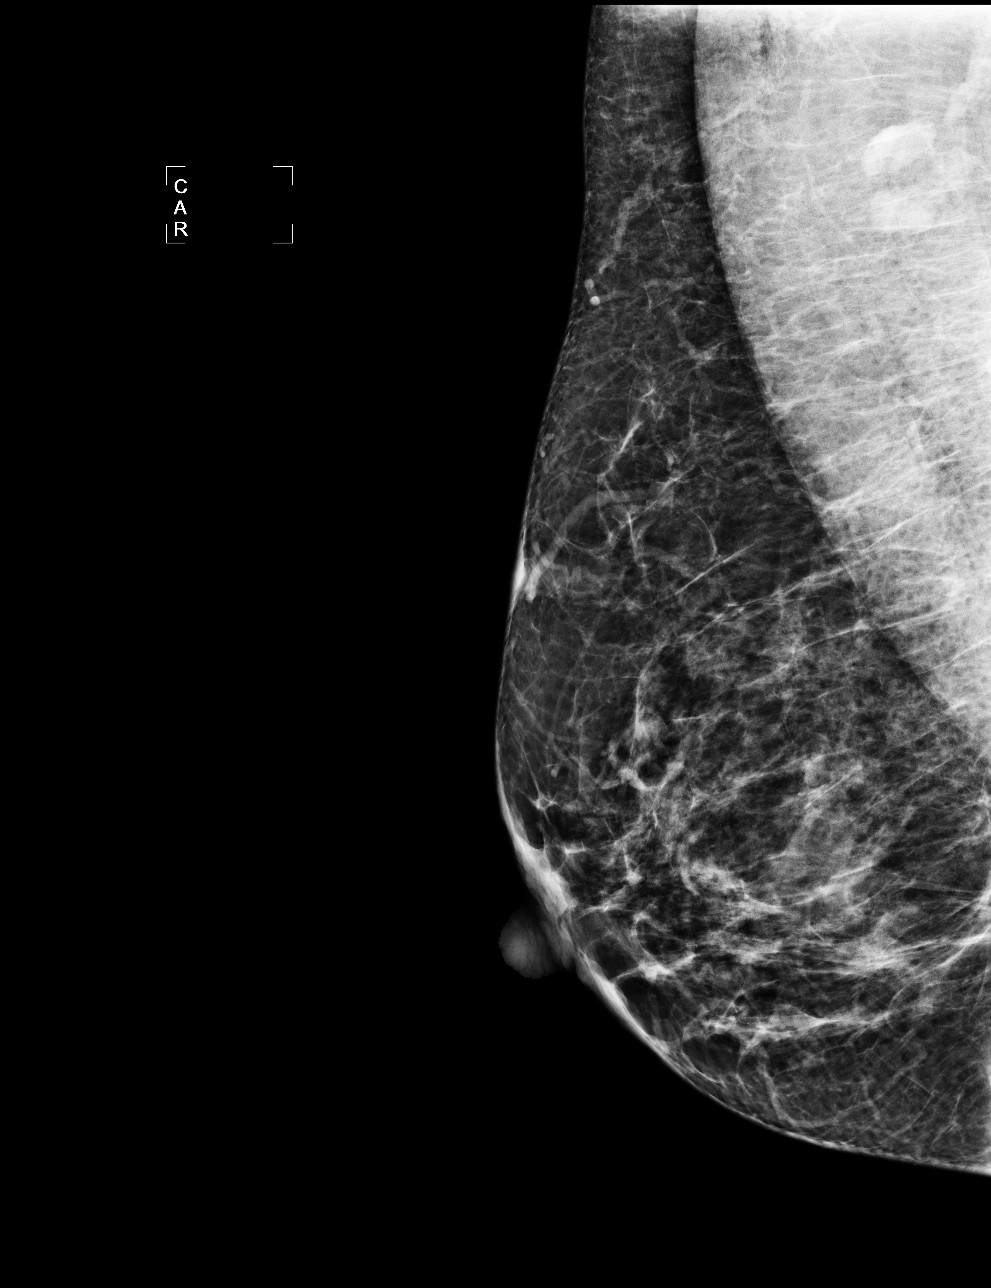

[4 of 4 positions shown; findings below may reference images not displayed]

IMPRESSION: No specific mammographic evidence of malignancy.  Next screening mammogram is recommended in one 
year.

ASSESSMENT: Negative - BI-RADS 1

Screening mammogram in 1 year.
ANALYZED BY COMPUTER AIDED DETECTION. , THIS PROCEDURE WAS A DIGITAL MAMMOGRAM.

## 2007-01-24 ENCOUNTER — Other Ambulatory Visit: Admission: RE | Admit: 2007-01-24 | Discharge: 2007-01-24 | Payer: Self-pay | Admitting: Family Medicine

## 2007-09-09 ENCOUNTER — Emergency Department (HOSPITAL_COMMUNITY): Admission: EM | Admit: 2007-09-09 | Discharge: 2007-09-09 | Payer: Self-pay | Admitting: Emergency Medicine

## 2007-09-09 IMAGING — CT CT ANGIO CHEST
2 of 3 series · 19 of 36 positions shown · IV contrast (APPLIED)
Comparison: Chest radiographs obtained earlier today.

CLINICAL DATA: Left upper chest pain and hemoptysis. History of sarcoidosis.

CT ANGIOGRAPHY OF CHEST - PULMONARY EMBOLISM PROTOCOL
TECHNIQUE: Multidetector CT imaging of the chest was performed according to the
protocol for detection of pulmonary embolism during bolus injection of
intravenous contrast.  Coronal and sagittal plane CT angiographic image
reconstructions were also generated.
Contrast:  80 cc Omnipaque 300

[Series 5: pe 3.0 b40f · axial · 0.62mm/px · z∈[-297,-60]mm · 16 of 87 slices shown]
[im 4/87  lung]
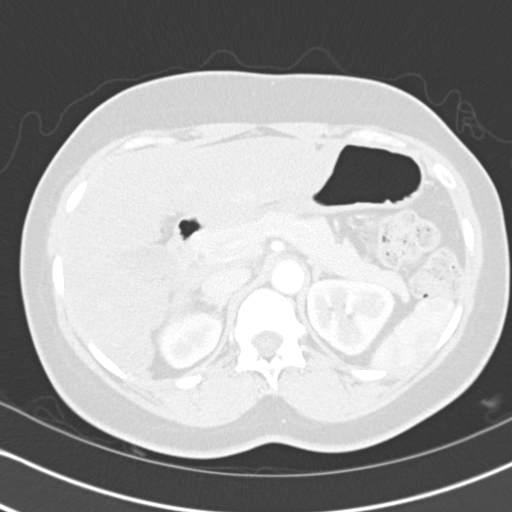
[im 10/87  mediastinal]
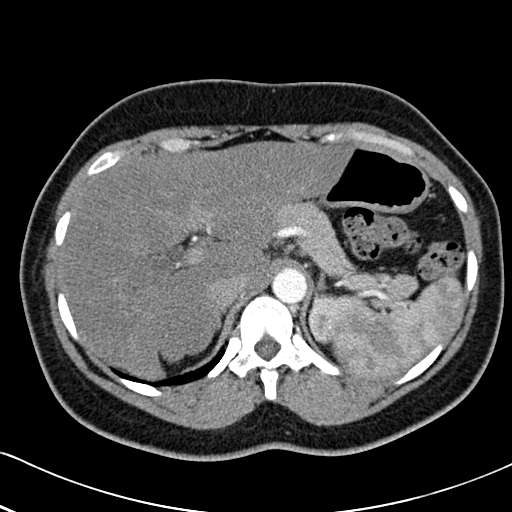
[im 16/87  lung]
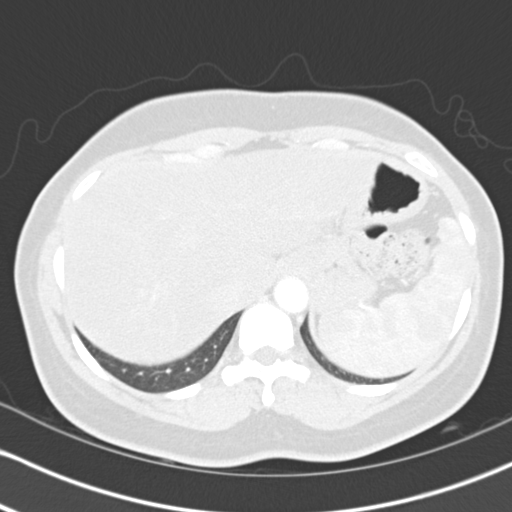
[im 20/87  mediastinal]
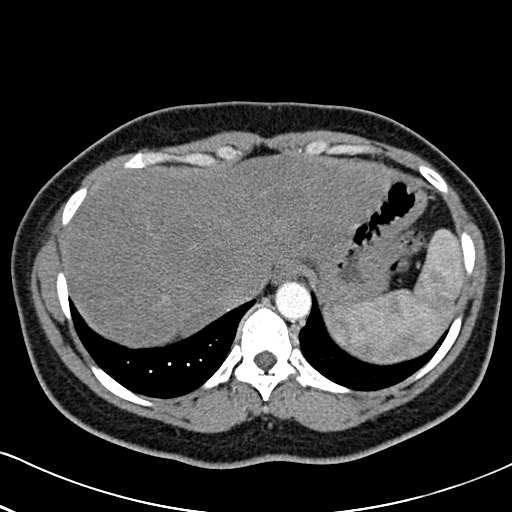
[im 26/87  lung]
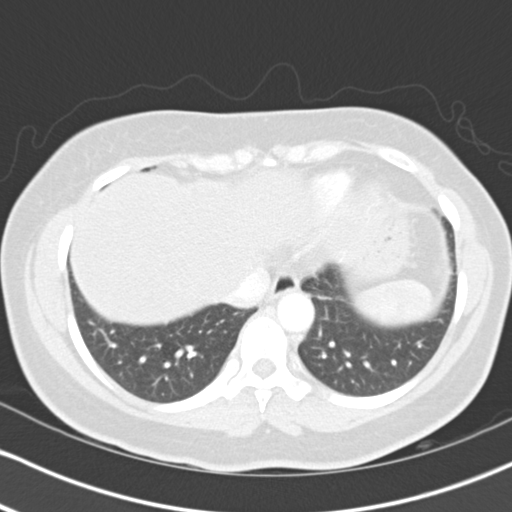
[im 29/87  mediastinal]
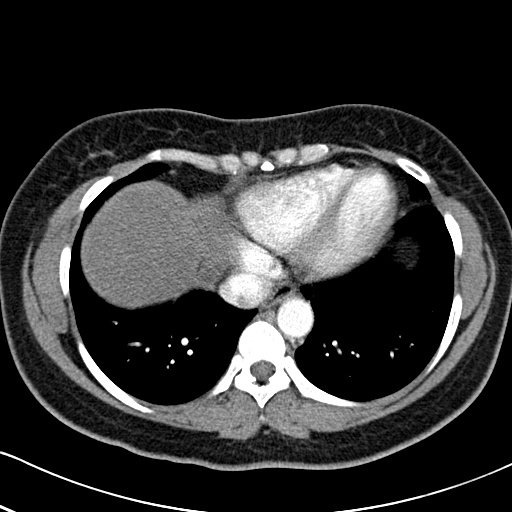
[im 36/87  lung]
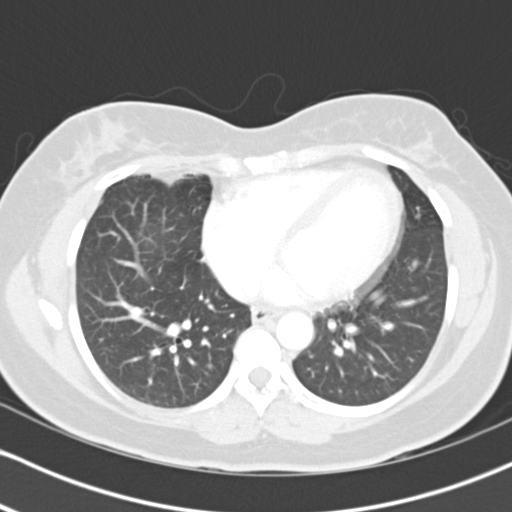
[im 42/87  mediastinal]
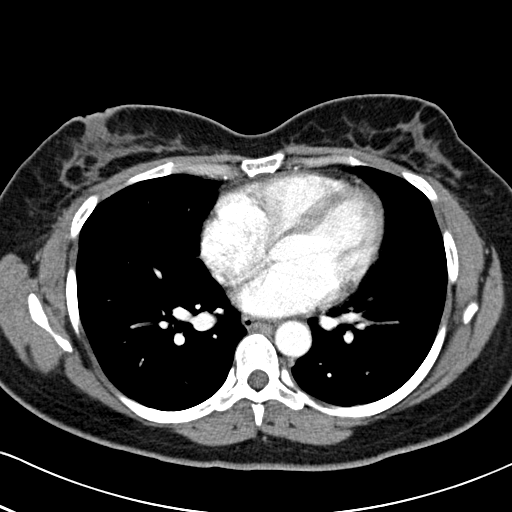
[im 45/87  lung]
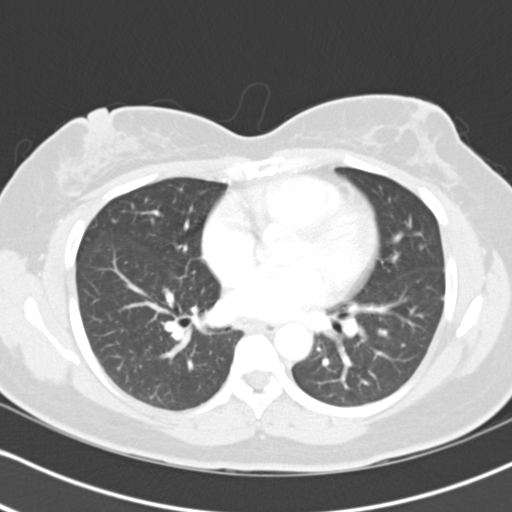
[im 51/87  mediastinal]
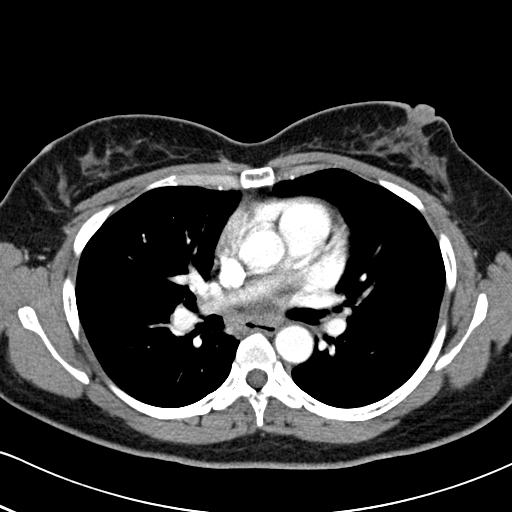
[im 58/87  lung]
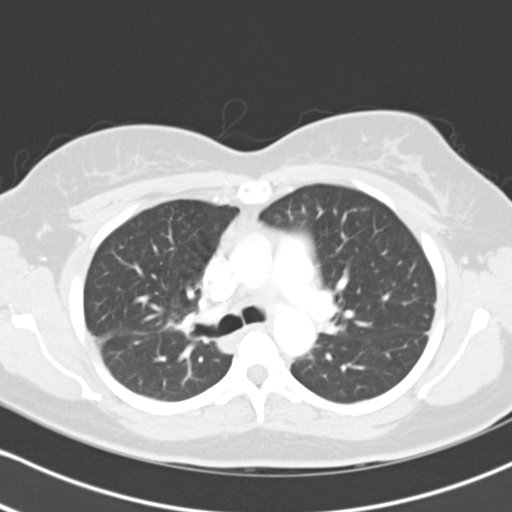
[im 61/87  mediastinal]
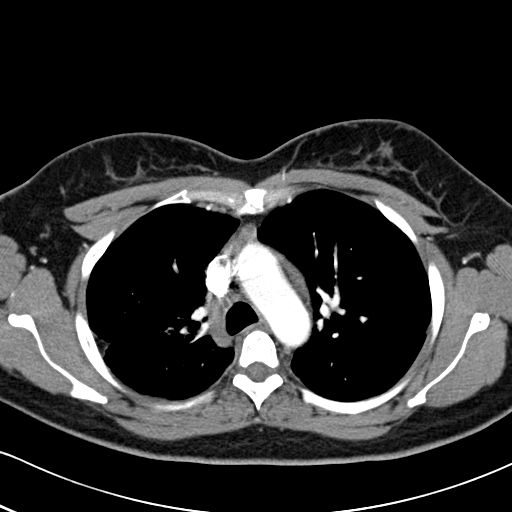
[im 67/87  lung]
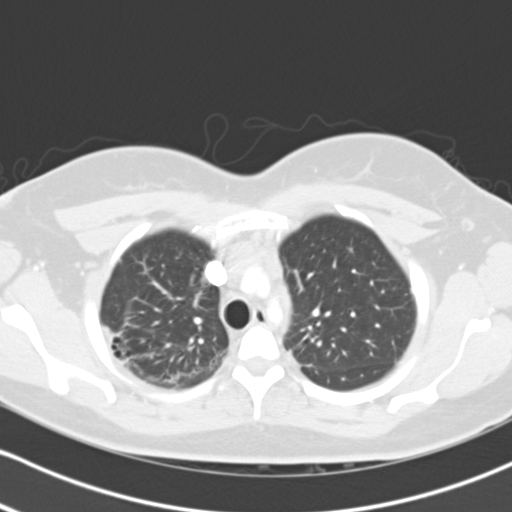
[im 71/87  mediastinal]
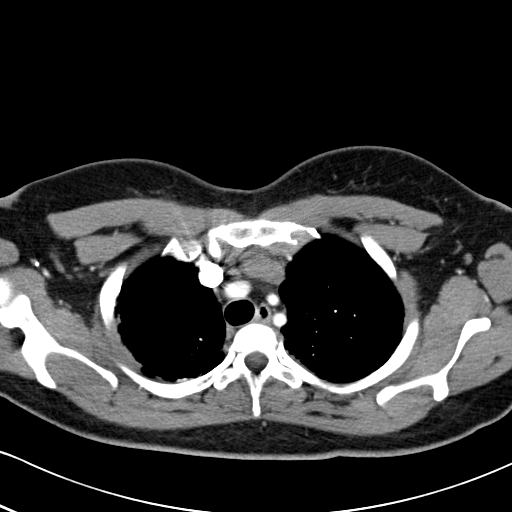
[im 77/87  lung]
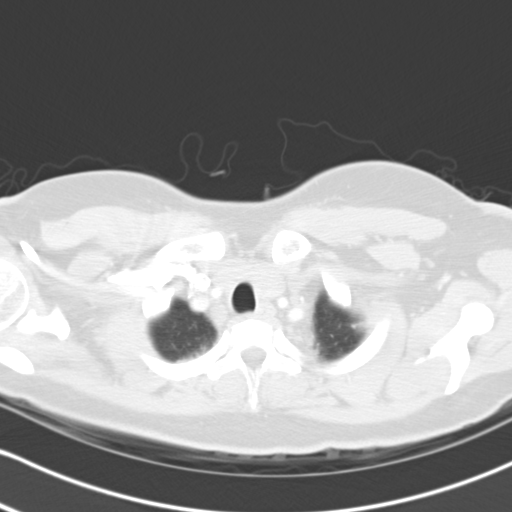
[im 83/87  mediastinal]
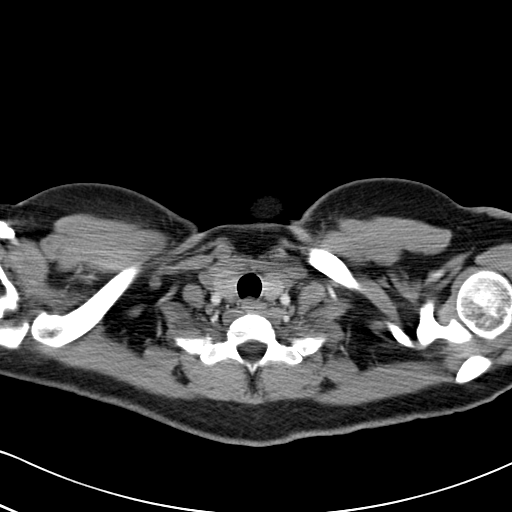

[Series 602: coronal chest · coronal · 0.62mm/px · 3 of 85 slices shown]
[im 17/85  mediastinal]
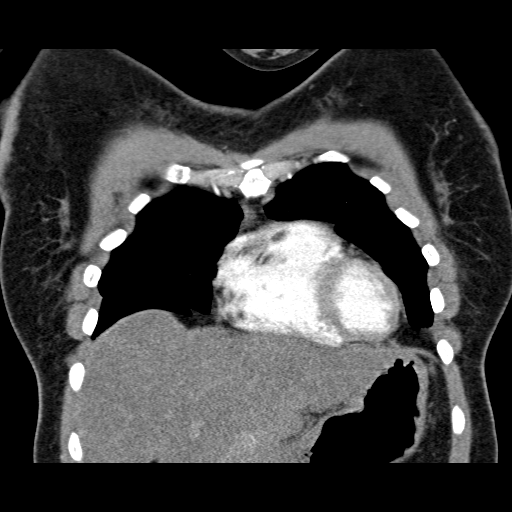
[im 34/85  mediastinal]
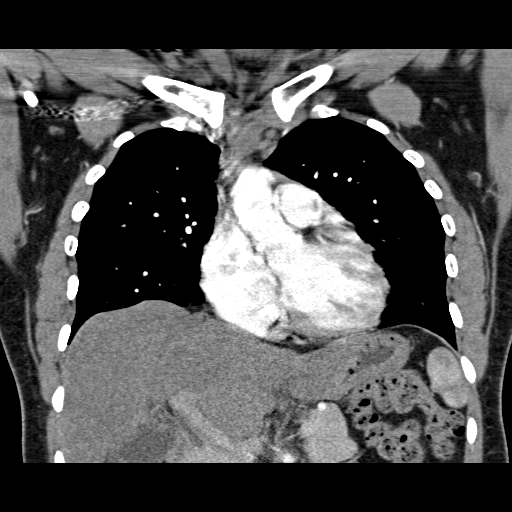
[im 51/85  mediastinal]
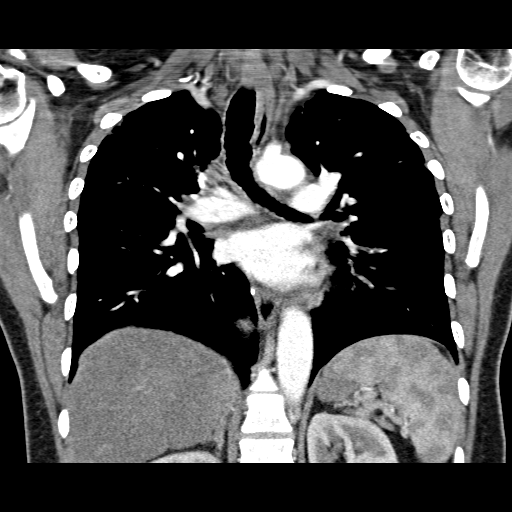

[19 of 36 positions shown; findings below may reference images not displayed]

FINDINGS: Normally opacified pulmonary arteries with no pulmonary arterial
filling defects seen. Right upper lobe cystic and cylindrical bronchiectasis
with scarring. Lesser degree of similar changes in the medial aspect of the left
upper lobe. Tiny subpleural nodular scar in the left lower lobe. No enlarged
lymph nodes. Diffuse fatty infiltration of the liver. Multiple gallstones in the
gallbladder. The largest measures 1.7 cm in maximum diameter. Unremarkable
bones.

IMPRESSION

1. No acute abnormality and no pulmonary emboli seen.
2. Bilateral upper lobe bronchiectasis and scarring, greater on the right.
3. Cholelithiasis.
4. Diffuse fatty infiltration of the liver.

## 2007-09-09 IMAGING — CR DG CHEST 2V
2 series · 2 of 2 positions shown · non-contrast
Comparison: [DATE]

CLINICAL DATA: This is a 46-year-old female with hemoptysis and cough. History of sarcoidosis. 
CHEST - 2 VIEW:

[w chest pa]
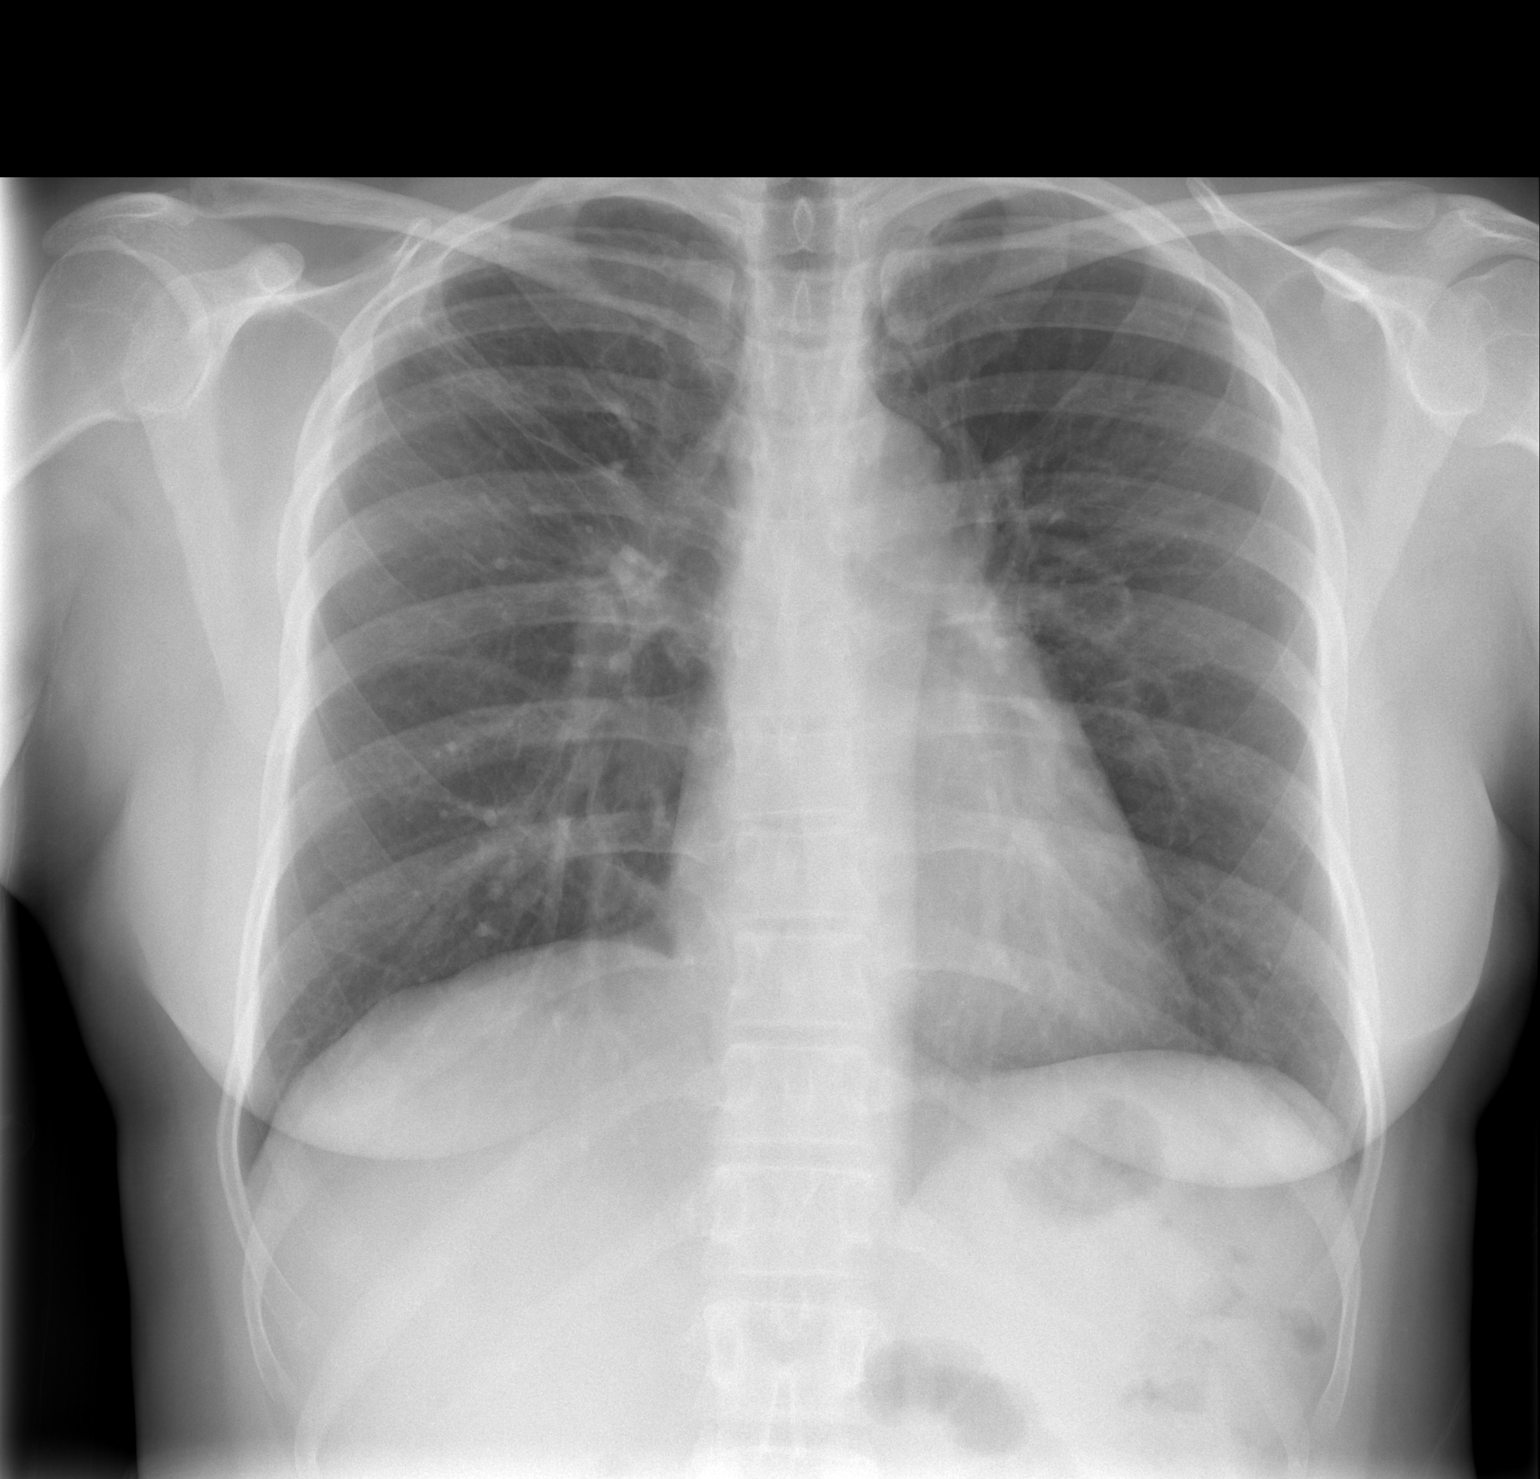

[w chest lat]
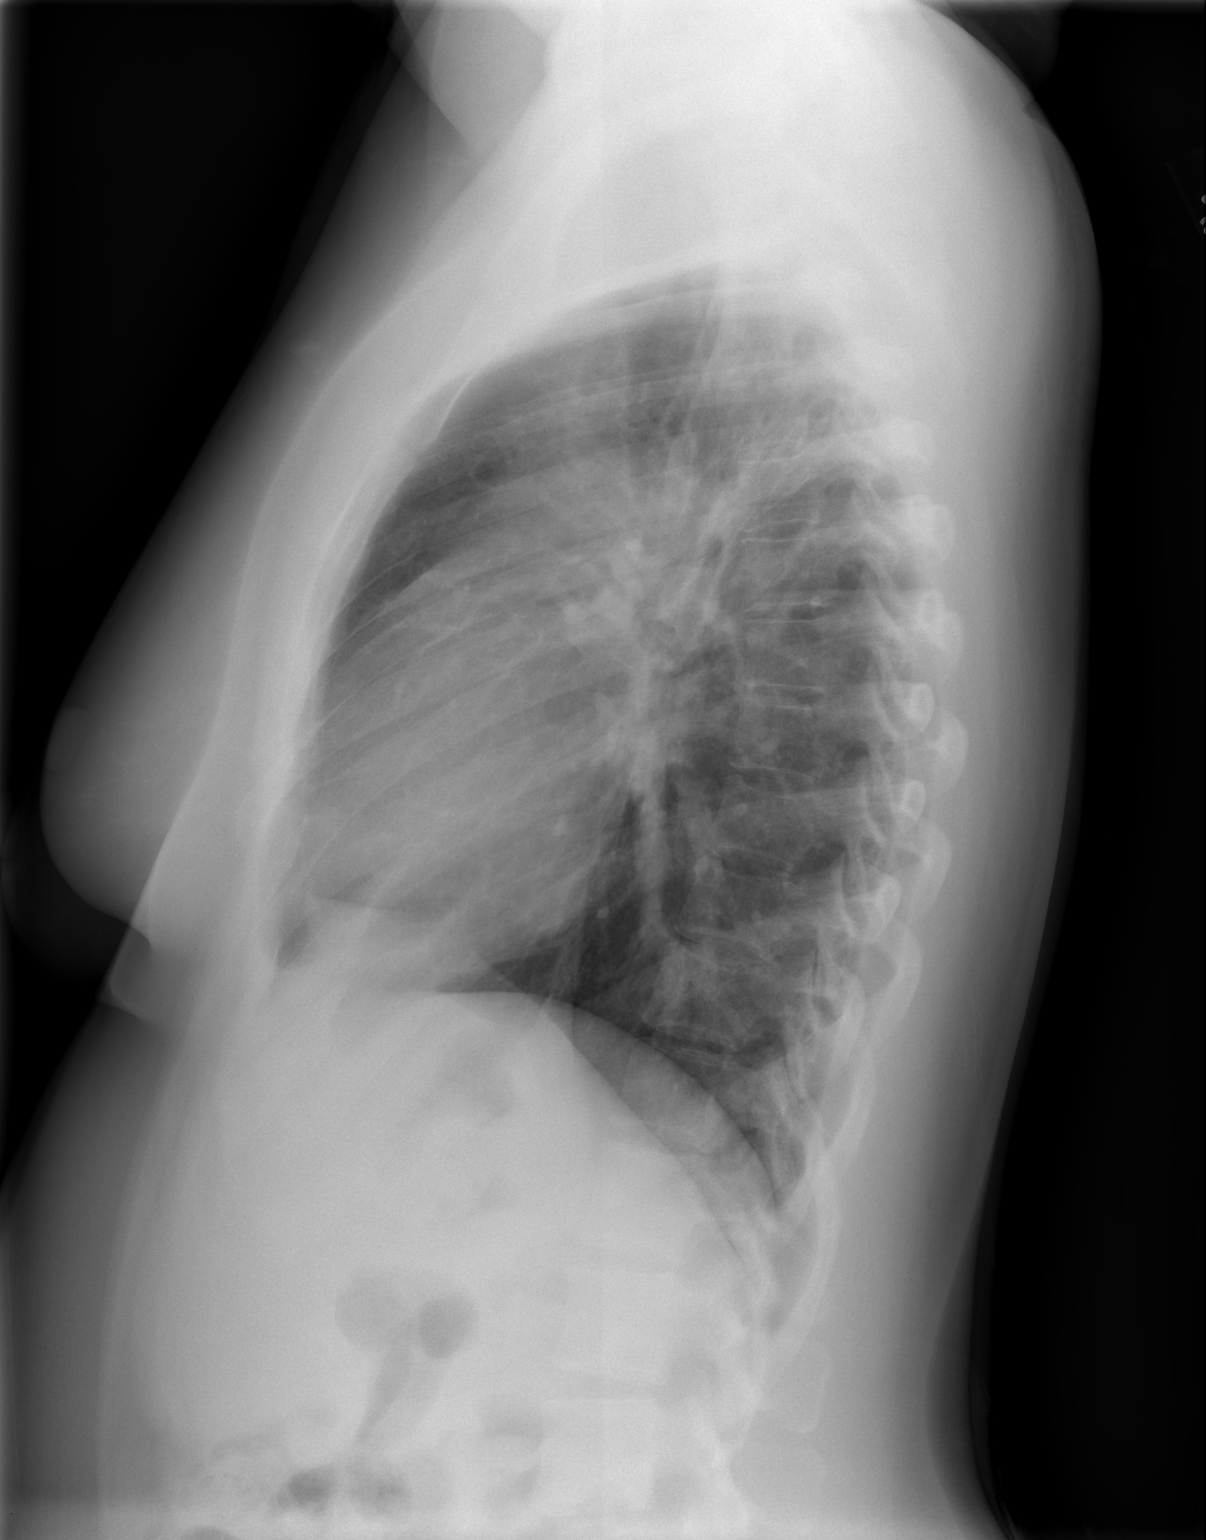

[2 of 2 positions shown; findings below may reference images not displayed]

FINDINGS: Scarring is again noted in the right upper lobe. This is chronic and stable. Normal heart size. No superimposed pneumonia, edema, effusion or pneumothorax.
IMPRESSION: Chronic right upper lobe scarring. No acute finding or interval change.

## 2008-10-16 ENCOUNTER — Ambulatory Visit: Payer: Self-pay | Admitting: Pulmonary Disease

## 2008-10-16 DIAGNOSIS — G4733 Obstructive sleep apnea (adult) (pediatric): Secondary | ICD-10-CM | POA: Insufficient documentation

## 2008-10-16 DIAGNOSIS — D259 Leiomyoma of uterus, unspecified: Secondary | ICD-10-CM | POA: Insufficient documentation

## 2008-10-16 DIAGNOSIS — D869 Sarcoidosis, unspecified: Secondary | ICD-10-CM | POA: Insufficient documentation

## 2008-10-16 DIAGNOSIS — J479 Bronchiectasis, uncomplicated: Secondary | ICD-10-CM | POA: Insufficient documentation

## 2008-10-16 IMAGING — CR DG CHEST 2V
2 series · 2 of 2 positions shown · non-contrast
Comparison: Chest x-ray of [DATE]

CLINICAL DATA: Preop for hysterectomy, history of sarcoidosis and
sleep apnea

CHEST - 2 VIEW

[view not recorded (1 of 2)]
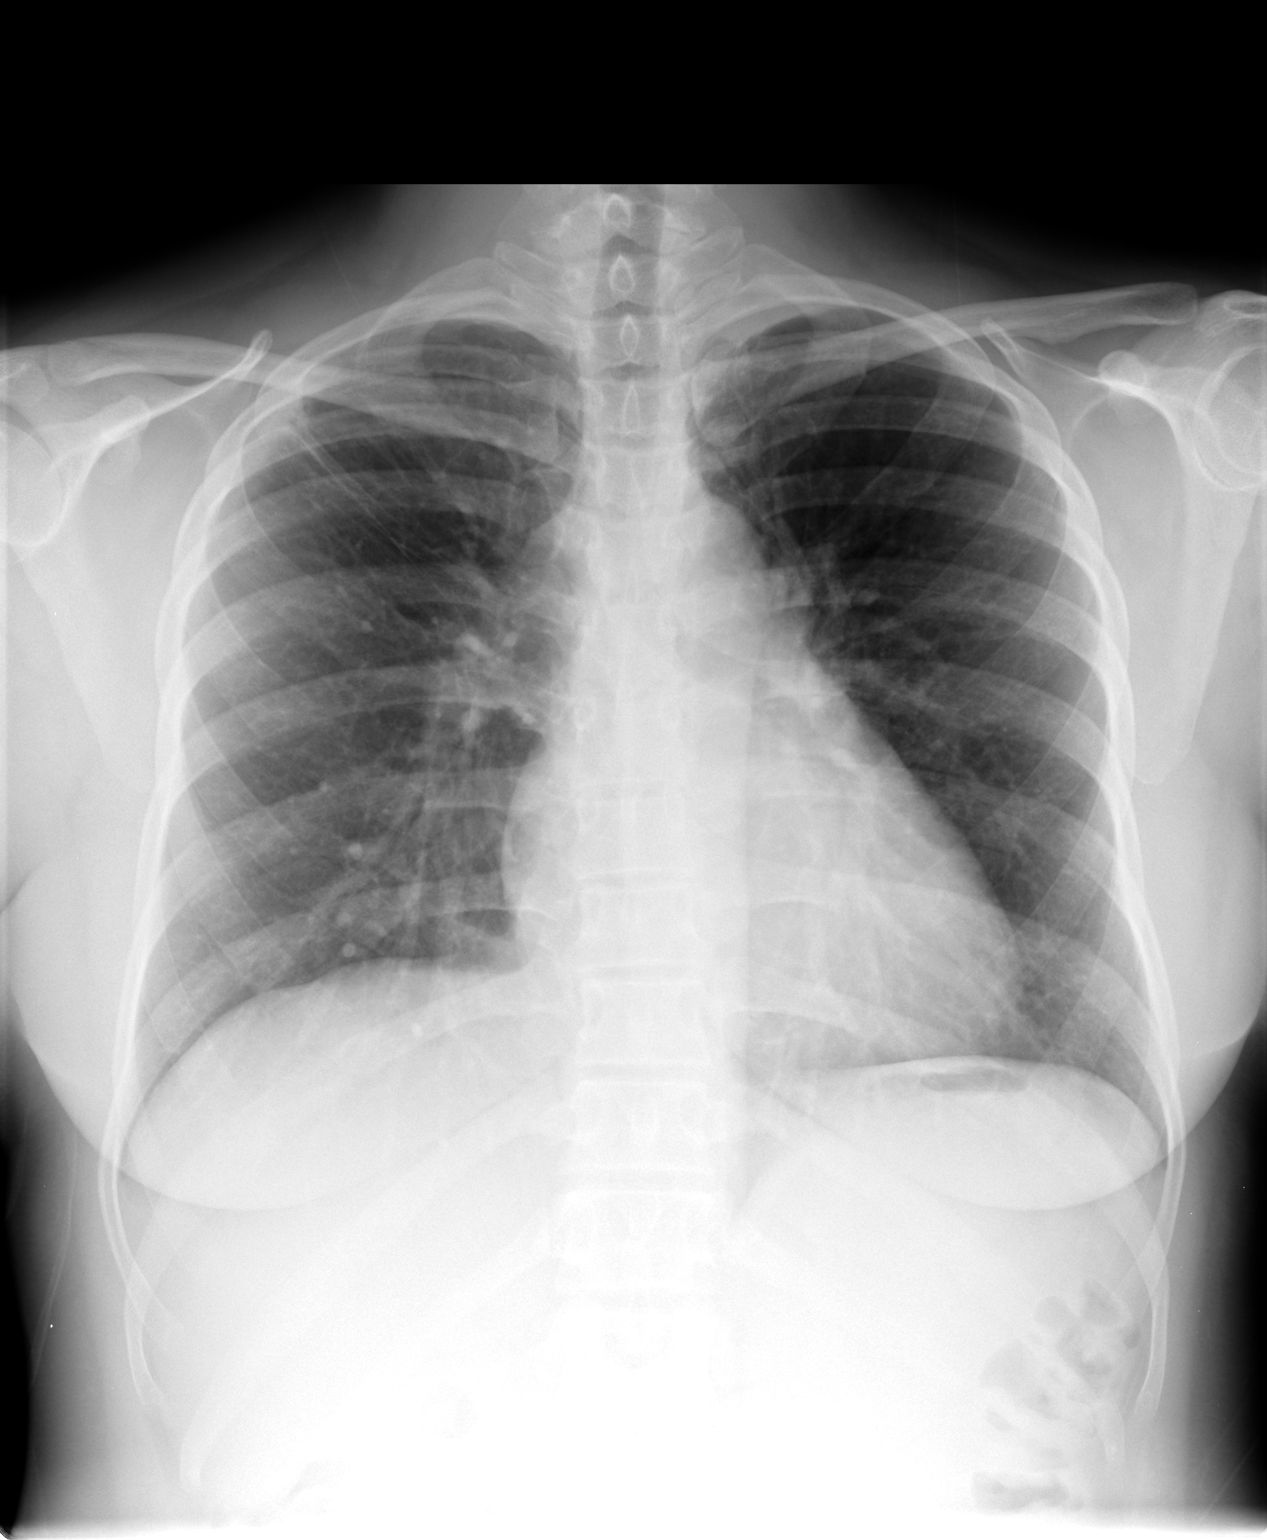

[view not recorded (2 of 2)]
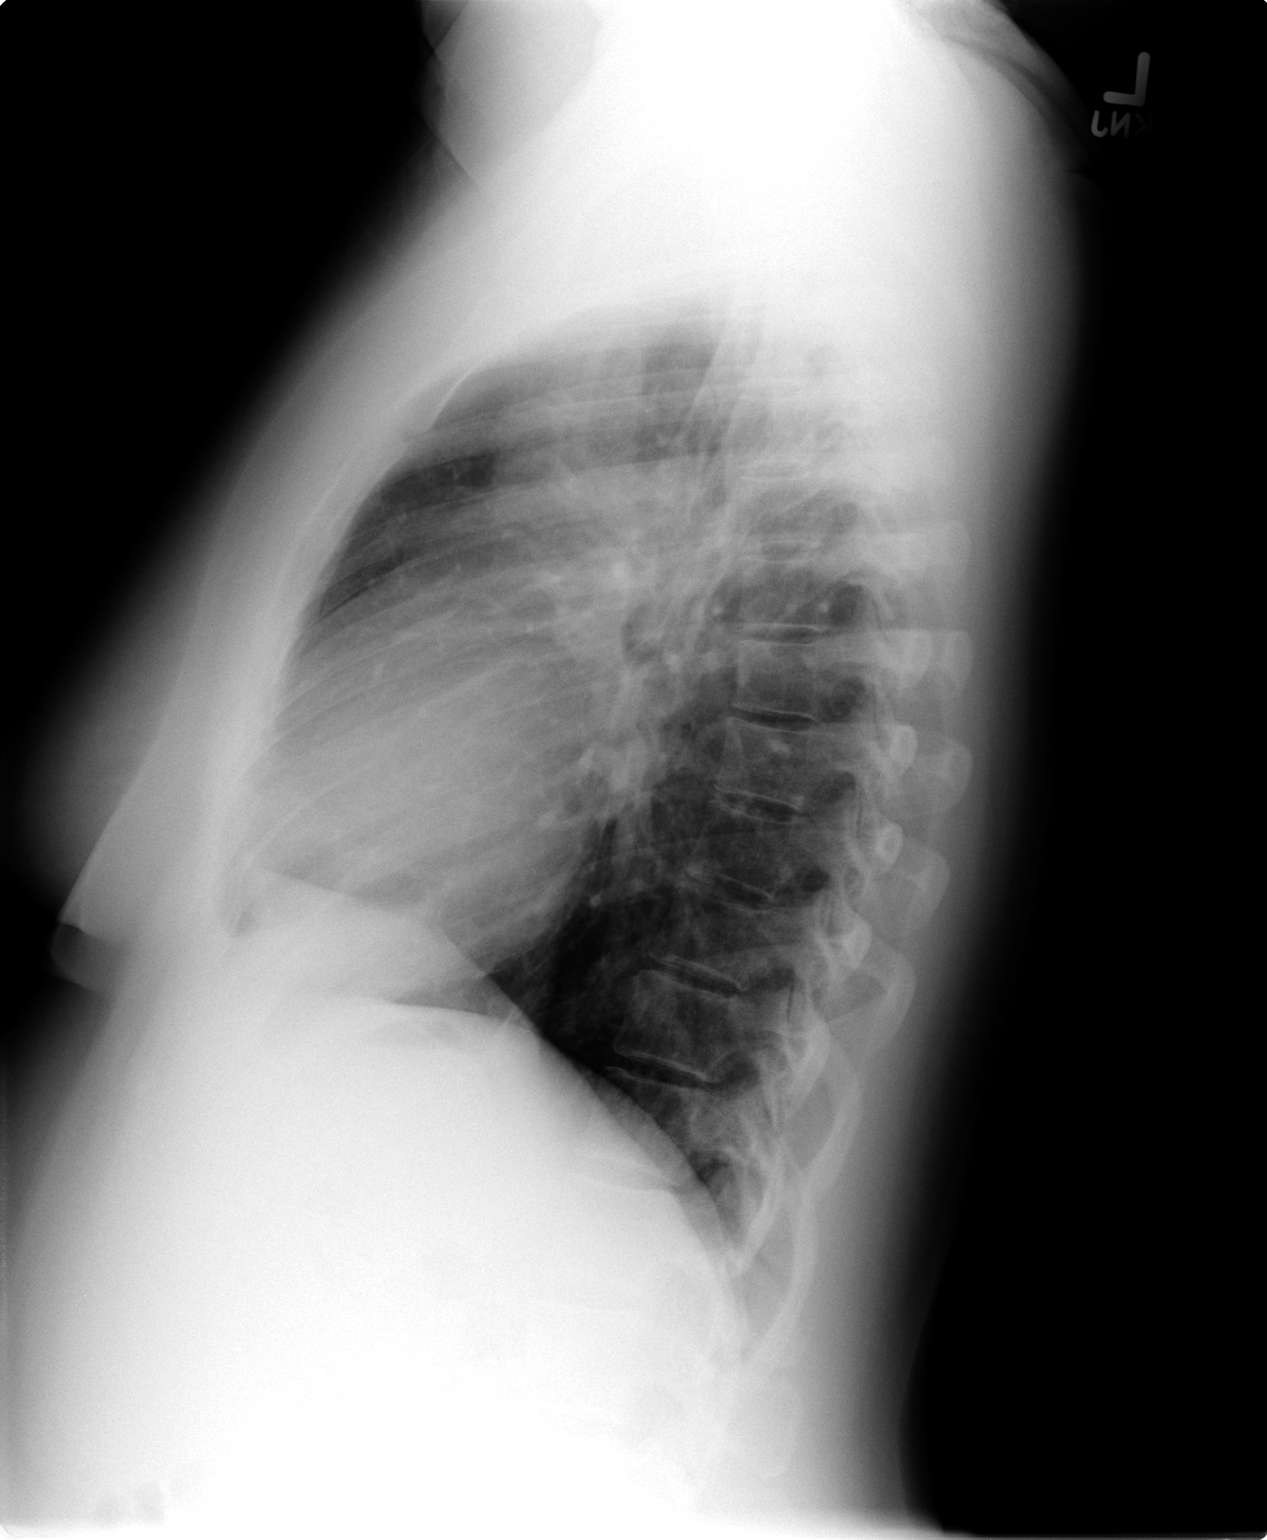

[2 of 2 positions shown; findings below may reference images not displayed]

FINDINGS: No active infiltrate or effusion is seen.  Scarring in
the right upper lung field is stable.  The heart is borderline
enlarged and stable.  No bony abnormality is seen.
IMPRESSION: Stable chest x-ray. No active lung disease.

## 2008-11-18 ENCOUNTER — Inpatient Hospital Stay (HOSPITAL_COMMUNITY): Admission: RE | Admit: 2008-11-18 | Discharge: 2008-11-21 | Payer: Self-pay | Admitting: Obstetrics and Gynecology

## 2008-11-18 ENCOUNTER — Encounter (INDEPENDENT_AMBULATORY_CARE_PROVIDER_SITE_OTHER): Payer: Self-pay | Admitting: Obstetrics and Gynecology

## 2010-02-09 ENCOUNTER — Encounter: Admission: RE | Admit: 2010-02-09 | Discharge: 2010-02-09 | Payer: Self-pay | Admitting: Emergency Medicine

## 2010-02-09 IMAGING — US US TRANSVAGINAL NON-OB
1 series · 14 of 25 positions shown · non-contrast
Comparison: [DATE]

CLINICAL DATA: Pelvic pain

TRANSABDOMINAL AND TRANSVAGINAL ULTRASOUND OF PELVIS
TECHNIQUE: Both transabdominal and transvaginal ultrasound
examinations of the pelvis were performed including evaluation of
the uterus, ovaries, adnexal regions, and pelvic cul-de-sac.

[Series 1: us transvaginal non-ob · 0.32mm/px · 14 of 49 slices shown]
[im 1/49]
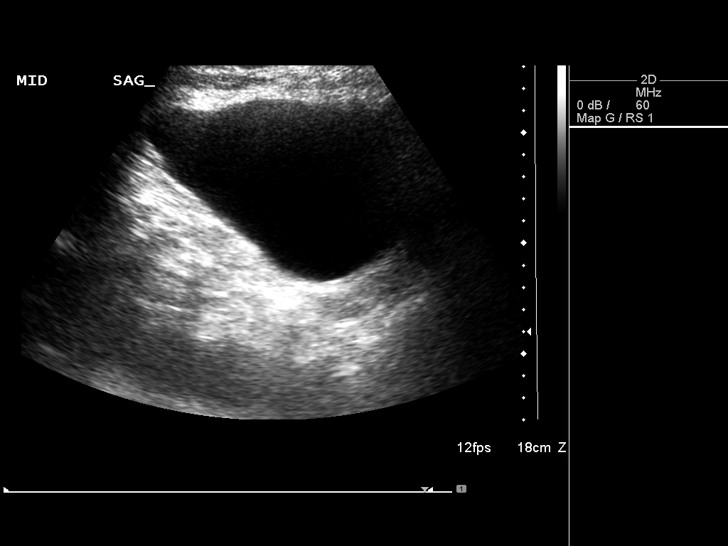
[im 5/49]
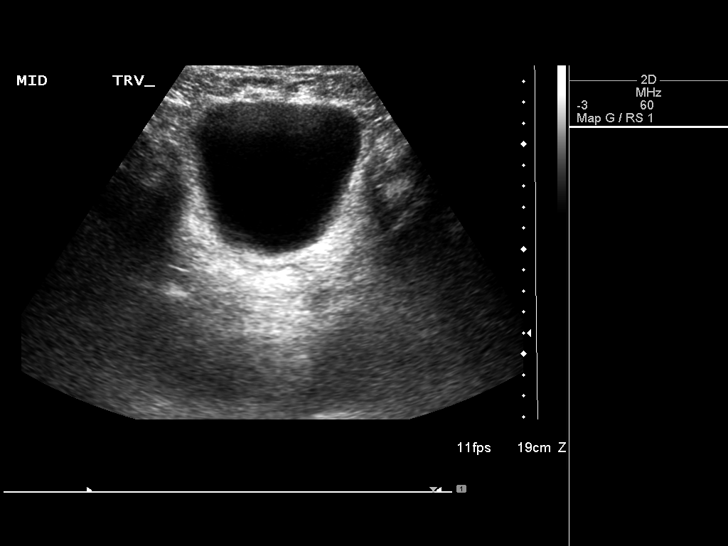
[im 9/49]
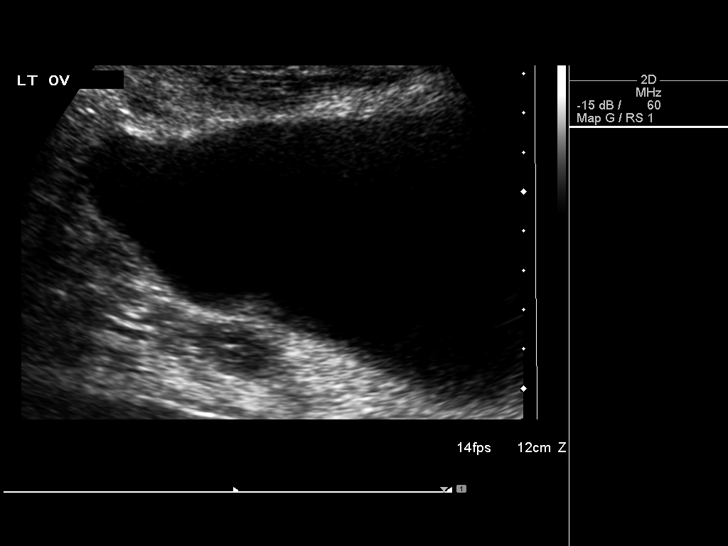
[im 13/49]
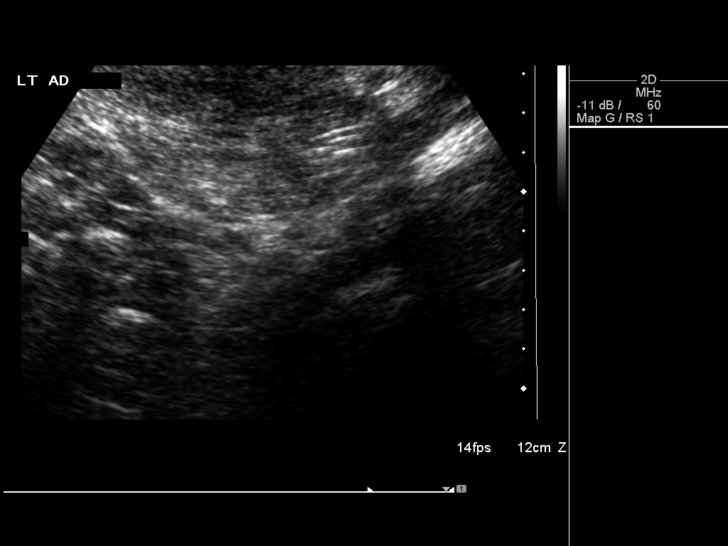
[im 17/49]
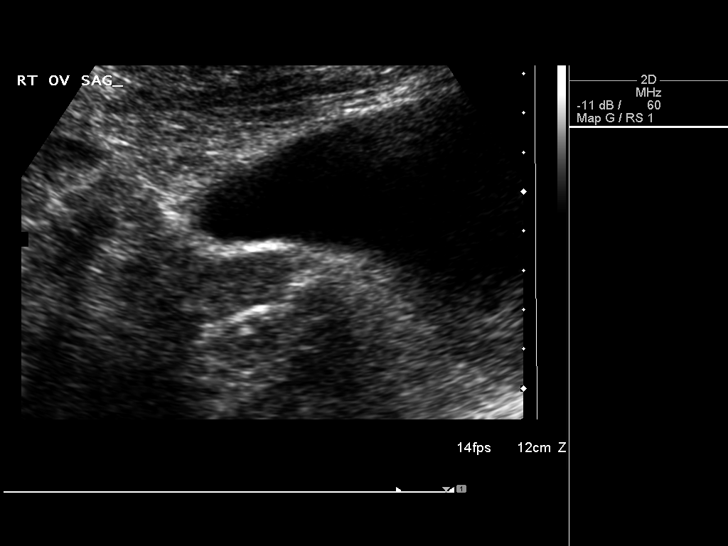
[im 19/49]
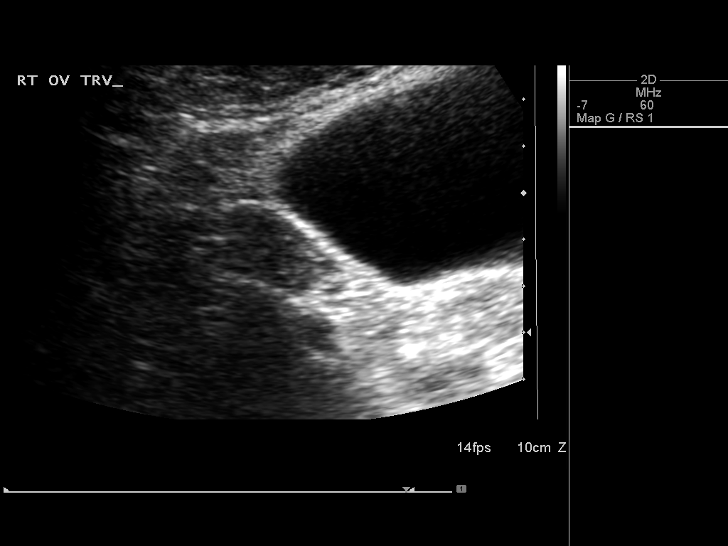
[im 23/49]
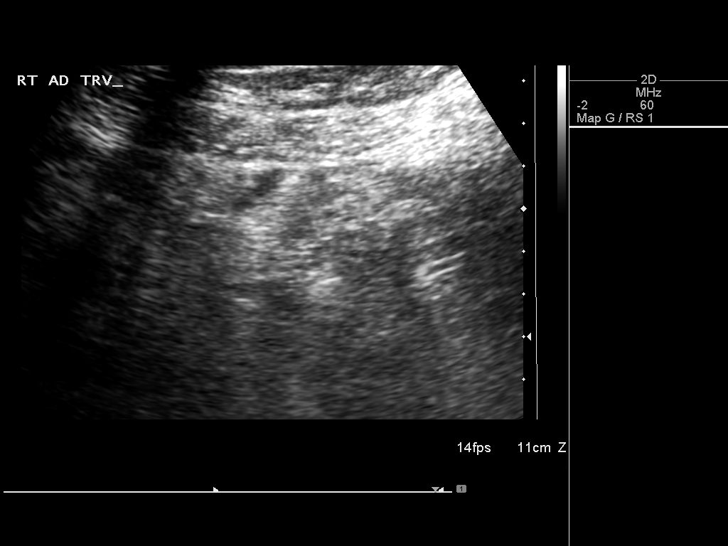
[im 27/49]
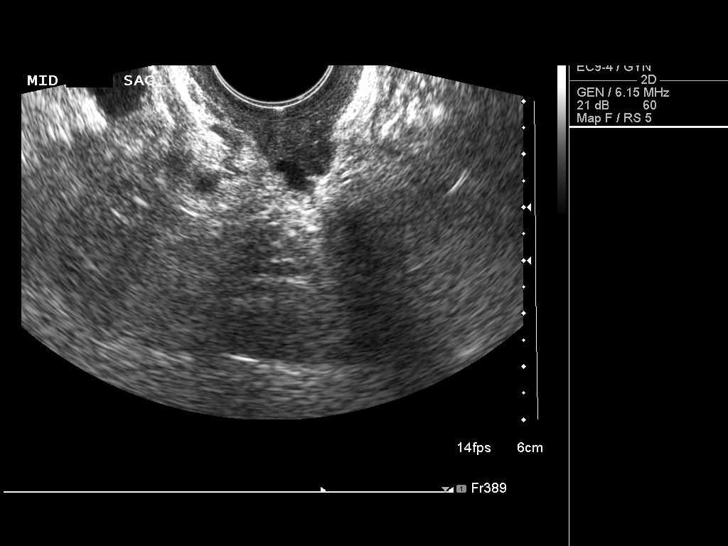
[im 31/49]
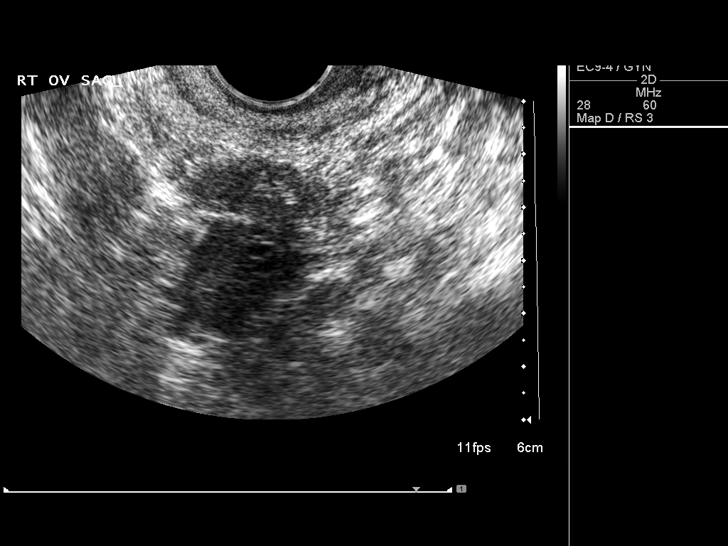
[im 33/49]
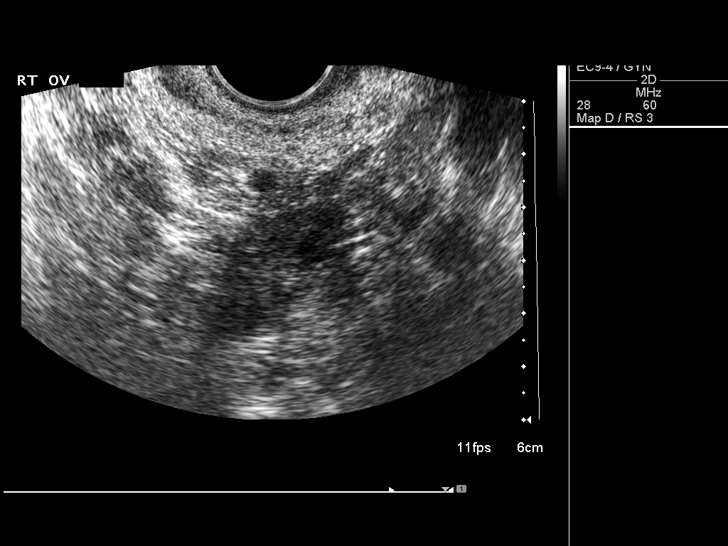
[im 37/49]
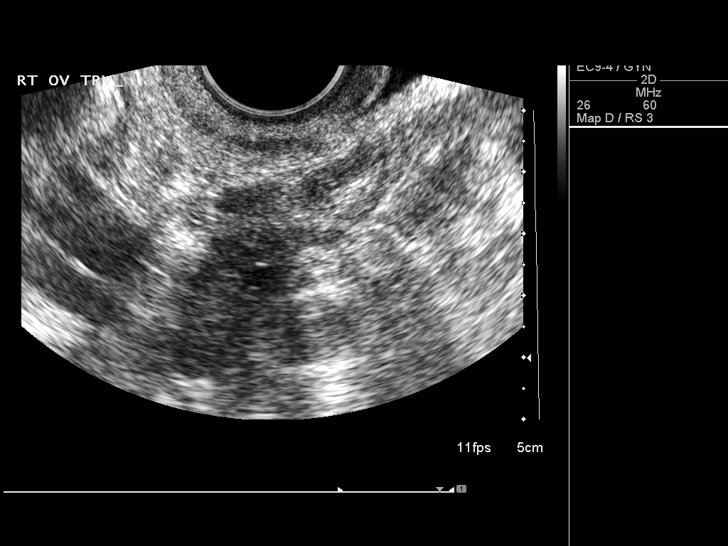
[im 41/49]
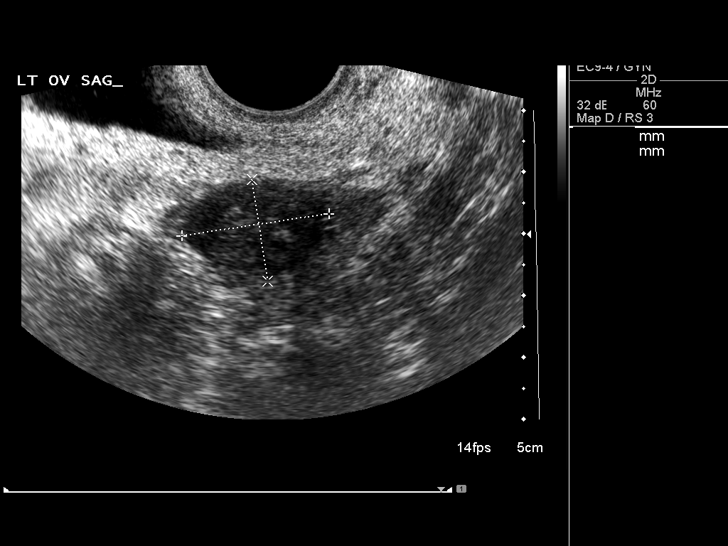
[im 45/49]
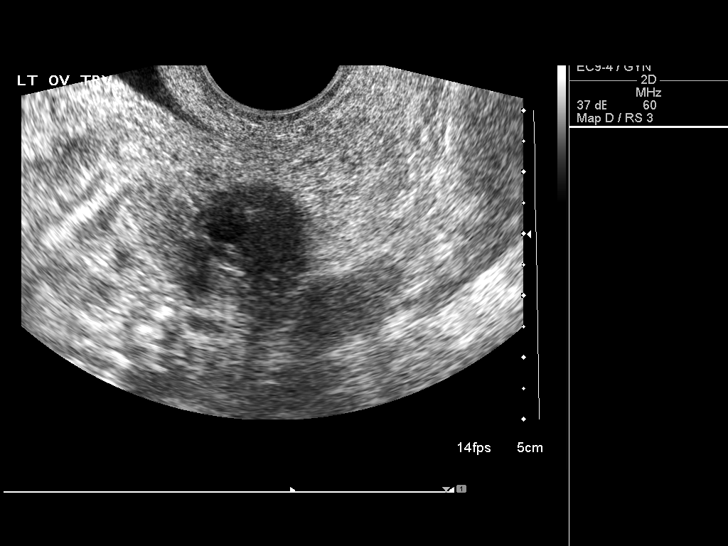
[im 49/49]
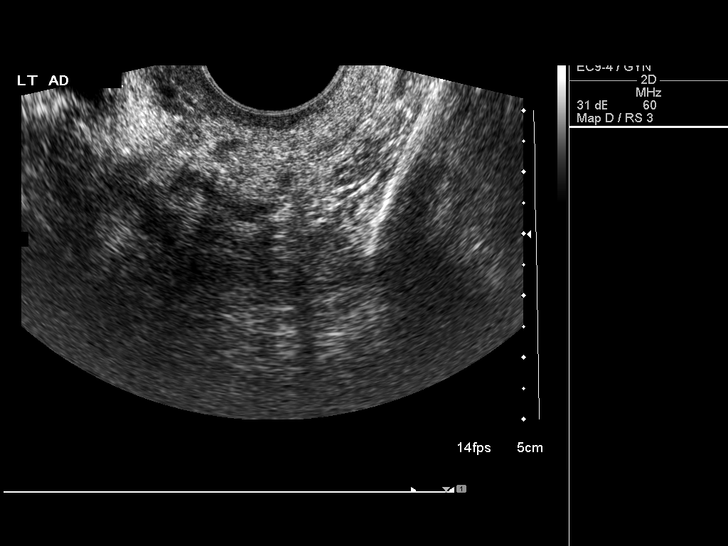

[14 of 25 positions shown; findings below may reference images not displayed]

FINDINGS: The uterus has been surgically removed.  Both ovaries
have a normal size and appearance.  The right ovary measures 2.6 x
1.6 x 1.6 cm, and the left ovary measures 2.4 x 1.8 x 1.4 cm.
There are no adnexal masses or free pelvic fluid.
IMPRESSION: Normal pelvic ultrasound.

## 2011-01-02 ENCOUNTER — Encounter: Payer: Self-pay | Admitting: Internal Medicine

## 2011-04-26 NOTE — Op Note (Signed)
NAMEFAITHANNE, VERRET NO.:  [id number]   MEDICAL RECORD NO.:  [id number]          PATIENT TYPE:  INP   LOCATION:  9305                          FACILITY:  [facility]   PHYSICIAN:  Dineen Kid. Rana Snare, M.D.    DATE OF BIRTH:  25-Jul-1961   DATE OF PROCEDURE:  11/18/2008  DATE OF DISCHARGE:                               OPERATIVE REPORT   PREOPERATIVE DIAGNOSES:  1. 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  2. Pelvic pain.  3. Abnormal uterine bleeding.  4. Stress urinary incontinence.  5. Cystocele.  6. Rectocele, which is symptomatic.   POSTOPERATIVE DIAGNOSES:  1. 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  2. Pelvic pain.  3. Abnormal uterine bleeding.  4. Stress urinary incontinence.  5. Cystocele.  6. Rectocele, which is symptomatic.   PROCEDURES:  1. Total abdominal hysterectomy.  2. Burch retropubic urethropexy.  3. Posterior colporrhaphy.   SURGEON:  Dineen Kid. Rana Snare, MD   ASSISTANT:  Freddy Finner, MD   ANESTHESIA:  General endotracheal.   INDICATIONS:  Ms. Ackley is a 50 year old with worsening abnormal  bleeding, menorrhagia, and pelvic pain.  She does have a 14-week size  fibroid uterus.  Underwent saline infusion ultrasound showing normal  endometrial cavity.  She has had minimal control of the bleeding on  birth control pills and desires definitive surgical intervention.  She  also has stress urinary incontinence and underwent cystometric showing  genuine stress urinary incontinence consistent with hypermobile urethra.  She also has symptomatic rectocele requiring digitalization.  She  presents for repair of the pelvic relaxation as well as the bladder  suspension.  The risks and benefits of the procedures were discussed at  length, which include but not limited to risk of infection, bleeding,  damage to the bowel, bladder, ureters, ovaries, possibly this may not  alleviate the leaking, it may worsen, there was also potential for  prolonged catheter wear,  risks associated with anesthesia, and risks  associated with blood transfusion were also discussed.  She does give an  informed consent and wished to proceed.   FINDINGS:  At the time of surgery, a 14-week size globular fibroid  uterus, normal-appearing ovaries, and normal-appearing appendix.   DESCRIPTION OF PROCEDURE:  After adequate analgesia, the patient was  placed in the dorsal lithotomy position.  She was sterilely prepped and  draped.  The bladder sterilely drained with a 30 mL Foley catheter.  Pfannenstiel skin incision was made 2 fingerbreadths above the pubic  symphysis, taken down sharply, the fascia was incised transversely,  extended superiorly and inferiorly off the bellies rectus muscle which  were separated sharply in midline.  Peritoneum was entered sharply.  O'Connor-O'Sullivan retractor was placed.  The bowel packed  cephalad.  The uterus was delivered through the incision.  LigaSure instruments  used to ligate across the utero-ovarian ligaments bilaterally down  across the round ligament and the inferior portions of the broad  ligaments with the ovary falling back into the abdominal cavity.  The  bladder was then skeletonized off the anterior surface of the cervix.  The LigaSure was used to ligate across the uterine vasculature at  the  level of the internal os.  The uterus was then removed.  The cervix was  then grasped.  Bladders again skeletonized off the cervix.  Heaney  clamps were used to clamp across the uterosacral ligaments, was then  cut, and ligated with 0 Monocryl suture.  The angles of the vagina was  then clamped with the Heaney clamp.  Vagina was entered and the cervix  was removed intact.  The vagina was then closed with a figure-of-eight  of 0 Monocryl suture.  The uterosacral LigaSure was then plicated in  midline using figure-of-eight of 0 Monocryl suture.  Irrigation was  applied and after adequate hemostasis was assured, the packing was then   removed, the peritoneum was closed, rectus muscle was retracted  laterally with the O'Connor-O'Sullivan retractor.  The operator's hand  was placed intravaginallly.  The Foley bulb was pulled down to the  inferior portion of the bladder.  The vagina was then elevated and a  suture of 0 Ethibond was placed to the right paravaginal tissue and a  pulley stitch approximately 1 cm lateral to the urethra and 1 cm  inferior to the inferior portion of the bladder.  Suture was then placed  through the Cooper's ligament.  This was done similarly on the left  paravaginal space, elevating the bladder neck with good support noted.  The sutures were then tied with good elevation of the UV angle and good  support noted.  The operator's hand was then regloved.  The rectus  muscle plicated in midline with 0 Monocryl suture.  The fascia was then  closed with 2 sutures of 0 Vicryl.  Irrigation applied and after  adequate hemostasis, the skin was stapled.  Legs were repositioned.  Examination of the vagina revealed excellent support of the previously  small cystocele and excellent urethral support.  At this time, an  incision was made and no anterior colporrhaphy would be necessary due to  the good support noted from the Burch procedure.  The posterior vaginal  mucosa was then grasped with 2 Allis clamps.  A triangular flap was made  across the perineal body.  The skin was then undermined with Metzenbaum  scissors.  The vagina was then dissected in the midline and reflected  laterally to approximately 2/3 of the vaginal length reduced in the  entire rectocele.  After the perirectal tissue was reflected off the  posterior vaginal mucosa, the perirectal fascia was then plicated in  midline using figure-of-eight of 0 Monocryl suture giving good posterior  support.  The excess vaginal mucosa was then trimmed and the posterior  vaginal mucosa was then closed with a running locking fashion of 2-0  Monocryl suture.   The superficial transverse perineal muscle was then  approximated with the 0 Monocryl interrupted suture.  The perineoplasty  was then completed using subcuticular suture of the 2-0 Monocryl suture  with good approximation and good hemostasis noted.  Vaginal exam at this  point revealed excellent anterior support as well as rectal support.  Rectal exam confirms the above.  The vagina was then packed with  estrogen gauze.  The 30 mL Foley was replaced with 10 mL Foley.  The  patient tolerated the procedure well, was stable on transfer to recovery  room.  Sponge and instrument counts were normal x3.  Estimated blood  loss 400 mL.  The patient received 900 mg of clindamycin and 500 mg of  Flagyl preoperatively.      Dineen Kid Rana Snare,  M.D.  Electronically Signed     DCL/MEDQ  D:  11/18/2008  T:  11/18/2008  Job:  [id number]

## 2011-04-29 NOTE — Op Note (Signed)
NAMESELISA, [name] NO.:  [id number]   MEDICAL RECORD NO.:  [id number]          PATIENT TYPE:  AMB   LOCATION:  ENDO                         FACILITY:  [facility]   PHYSICIAN:  Danise Edge, M.D.   DATE OF BIRTH:  05/02/1961   DATE OF PROCEDURE:  03/23/2005  DATE OF DISCHARGE:                                 OPERATIVE REPORT   PROCEDURE INDICATIONS:  Jackie [name] is a 50 year old female born January 02, 1961. On October 09, 1996, she underwent an ultrasound-guided liver biopsy  at [facility] to evaluate elevated liver enzymes associated with a  history of chronic sarcoidosis. Her liver biopsy pathology returned multiple  noncaseating granuloma with slight fatty changes consistent with her  diagnosis of sarcoidosis. There was no evidence for the presence of  cirrhosis or fibrous tissue.   When Jackie [name] developed abdominal pain, she underwent a CT scan of the  abdomen looking for kidney stones. There were no kidney stones. CT scan of  the liver revealed a lobulated contour with slight splenomegaly.   Jackie [name] is experiencing constipation manifested by the daily passage of  small hard stool without bleeding.   ENDOSCOPIST:  Danise Edge, M.D.   PREMEDICATION:  Versed 7.5 mg, Demerol 70 mg.   PROCEDURE:  After obtaining informed consent, Jackie [name] was placed in the  left lateral decubitus position. I administered intravenous Demerol and  intravenous Versed to achieve conscious sedation for the procedure. The  patient's blood pressure, oxygen saturation and cardiac rhythm were  monitored throughout the procedure and documented in the medical record.   Anal inspection and digital rectal exam were normal. The Olympus adjustable  pediatric colonoscope was introduced into the rectum and easily advanced to  the cecum. A normal-appearing appendiceal orifice was identified. The  ileocecal valve was intubated and the distal ileum inspected. Colonic  preparation exam today was excellent.   Rectum normal.   Sigmoid colon and descending colon normal.   Splenic flexure normal.   Transverse colon normal.   Hepatic flexure normal.   Descending colon normal.   Cecum and ileocecal valve normal.   Distal ileum normal.   ASSESSMENT:  Normal proctocolonoscopy to the cecum.   Fasting laboratory data pending:  Thyroid stimulating hormone level, CBC,  complete metabolic profile, and fasting lipid profile.      MJ/MEDQ  D:  03/23/2005  T:  03/23/2005  Job:  [id number]   cc:   Molly Maduro A. Nicholos Johns, M.D.  [address]  [place]  [place] [address]  Fax: [phone]

## 2011-04-29 NOTE — Op Note (Signed)
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   OPERATION:  Removal of right breast mass.   SURGEON:  Currie Paris, M.D.   ANESTHESIA:  MAC.   CLINICAL HISTORY:  This is a 50 year old lady with a fairly well  circumscribed right breast mass.  Core biopsy was not clear that this was  benign fibroadenoma and presented the question of a possible phyllodes  tumor.  Therefore, excisional biopsy was recommended.   DESCRIPTION OF PROCEDURE:  The patient was seen in the holding area and had  no further questions.  She had already marked the right breast as the  operative site and I did the same.  The patient was taken to the operating  room and after satisfactory IV sedation, the right breast was prepped and  draped.  The time out occurred.   I then infiltrated 1% Xylocaine over the area of the mass. I had actually  done an ultrasound prior to giving IV sedation to confirm the location of  the mass.  After the area was anesthetized, I made a curvilinear incision  directly over the mass to divide a little of the subcutaneous tissue with  the cautery.  The mass was palpable and I was able to put a suture through  it for traction.  I then was able to use a combination of cautery and  scissors to excise the mass  with what appeared to be a small margin around it.  It felt fairly discrete  and smooth.  The biopsy site was checked for hemostasis and once everything  appeared to  be dry, was closed with 3-0 Vicryl and 4-0 Monocryl  subcuticular.  The patient tolerated the procedure well.  There were no  operative complications.  All counts were correct.      CJS/MEDQ  D:  04/15/2005  T:  04/16/2005  Job:  [id number]   cc:   Molly Maduro A. Nicholos Johns, M.D.  [address]  [place]  [place] [address]  Fax: [phone]   Dineen Kid. Rana Snare, M.D.  [address]  [place]  [place] [address]  Fax: [phone]

## 2011-04-29 NOTE — Procedures (Signed)
[name], Jackie [name] NO.:  [id number]   MEDICAL RECORD NO.:  [id number]          PATIENT TYPE:  OUT   LOCATION:  SLEEP CENTER                 FACILITY:  [facility]   PHYSICIAN:  Marcelyn Bruins, M.D. [facility] DATE OF BIRTH:  [birth year]   DATE OF STUDY:  07/15/2005                              NOCTURNAL POLYSOMNOGRAM   REFERRING PHYSICIAN:  Danice Goltz, M.D.   DAE OF STUDY:  July 15, 2005.   INDICATIONS FOR STUDY:  Persistent disorder of initiating and maintaining  sleep.   EPWORTH SLEEPINESS SCORE:  5.   SLEEP ARCHITECTURE:  The patient had total sleep time of 385 minutes with  adequate slow wave sleep but decreased REM. Sleep onset latency was normal,  and REM onset was prolonged at 202 minutes. Sleep that efficiency was 92%.   IMPRESSION:  1.  Moderate obstructive sleep apnea/hypopnea syndrome with a respiratory      disturbance index of 23 events per hour and O2 desaturation as low as      84%. Events were not positional. Treatment for this degree of sleep      apnea may include weight loss, upper airway surgery, oral appliance,      CPAP. Clinical correlation is suggested.  2.  Mild snoring noted throughout the study  3.  Frequent PVCs and couplets that were multifocal are noted throughout.  4.  Small numbers of leg jerks with minimal sleep disruption.           ______________________________  Marcelyn Bruins, M.D. [facility]  Diplomate, American Board of Sleep  Medicine     KC/MEDQ  D:  07/27/2005 11:23:09  T:  07/27/2005 11:46:11  Job:  [id number]

## 2011-04-29 NOTE — Discharge Summary (Signed)
NAMEJORDY, HEWINS NO.:  [id number]   MEDICAL RECORD NO.:  [id number]          PATIENT TYPE:  INP   LOCATION:  9305                          FACILITY:  [facility]   PHYSICIAN:  Dineen Kid. Rana Snare, M.D.    DATE OF BIRTH:  [birth year]   DATE OF ADMISSION:  11/18/2008  DATE OF DISCHARGE:  11/21/2008                               DISCHARGE SUMMARY   HISTORY OF PRESENT ILLNESS:  Ms. 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  She has had minimal control with birth control pills and she  desires definitive surgical intervention.  She also has stress urinary  incontinence, underwent cystometric showing genuine stress urinary  incontinence with hypermobile urethra.  She also has a symptomatic  rectocele, which requires digitalization.  She also presents for repair  of that as well as bladder suspension.   HOSPITAL COURSE:  The patient underwent a total abdominal hysterectomy  with Burch retropubic urethropexy and posterior colporrhaphy.  The  procedure was uncomplicated.  Her estimated blood loss was 400 mL.  Her  postoperative care was similarly complicated.  Postoperative day #1, she  was able to ambulate without difficulty, had return of bowel function.  She was unable to empty her bladder completely with her voiding trial  and Foley catheter was replaced.  She did have a postoperative  hemoglobin 9.9.  By postoperative day #3, she was tolerating a regular  diet, ambulating without difficulty, had regular active bowel sounds.  Incision was clean, dry, and intact.  However, at the time of discharge  was still unable to void satisfactorily with very low residual and so  Foley catheter was placed with her return in the office.   DISPOSITION:  The patient is discharged home with followup in the office  in 2 days for removal of Foley  catheter and voiding trial.  She sent  home prescription for Tylox, #30, told to return for increased pain,  fever, or bleeding.  Also, sent home with a prescription for Macrobid to  take on a regular basis for prevention of bacteria.      Dineen Kid Rana Snare, M.D.  Electronically Signed     DCL/MEDQ  D:  01/31/2009  T:  02/01/2009  Job:  [id number]

## 2011-09-12 ENCOUNTER — Other Ambulatory Visit: Payer: Self-pay | Admitting: Family Medicine

## 2011-09-12 DIAGNOSIS — Z1231 Encounter for screening mammogram for malignant neoplasm of breast: Secondary | ICD-10-CM

## 2011-09-15 LAB — CBC
HCT: 38.5 % (ref 36.0–46.0)
Hemoglobin: 13.4 g/dL (ref 12.0–15.0)
Hemoglobin: 9.9 g/dL — ABNORMAL LOW (ref 12.0–15.0)
MCHC: 34.6 g/dL (ref 30.0–36.0)
MCHC: 34.8 g/dL (ref 30.0–36.0)
MCV: 84.6 fL (ref 78.0–100.0)
Platelets: 286 10*3/uL (ref 150–400)
Platelets: 307 10*3/uL (ref 150–400)
RBC: 3.31 MIL/uL — ABNORMAL LOW (ref 3.87–5.11)

## 2011-09-15 LAB — COMPREHENSIVE METABOLIC PANEL
ALT: 125 U/L — ABNORMAL HIGH (ref 0–35)
AST: 88 U/L — ABNORMAL HIGH (ref 0–37)
Albumin: 3.6 g/dL (ref 3.5–5.2)
Alkaline Phosphatase: 104 U/L (ref 39–117)
CO2: 26 mEq/L (ref 19–32)
Calcium: 9.6 mg/dL (ref 8.4–10.5)
Creatinine, Ser: 0.55 mg/dL (ref 0.4–1.2)
GFR calc Af Amer: >60 mL/min (ref >60)
GFR calc non Af Amer: >60 mL/min (ref >60)

## 2011-09-22 LAB — CBC
HCT: 38.8
Hemoglobin: 13.5
MCHC: 34.8
MCV: 83.4
Platelets: 200
RBC: 4.66
RDW: 12.8
WBC: 5.6

## 2011-09-22 LAB — BASIC METABOLIC PANEL
BUN: 7
CO2: 25
Calcium: 9.3
Chloride: 106
Creatinine, Ser: 0.66
GFR calc Af Amer: >60
GFR calc non Af Amer: >60
Glucose, Bld: 102 — ABNORMAL HIGH
Potassium: 4
Sodium: 138

## 2011-09-22 LAB — POCT CARDIAC MARKERS
CKMB, poc: <1 — ABNORMAL LOW
Myoglobin, poc: 62.3
Operator id: 3206
Troponin i, poc: <0.05

## 2011-09-22 LAB — DIFFERENTIAL
Basophils Absolute: 0
Basophils Relative: 0
Eosinophils Absolute: 0
Eosinophils Relative: 1
Lymphocytes Relative: 25
Lymphs Abs: 1.4
Monocytes Absolute: 0.3
Monocytes Relative: 6
Neutro Abs: 3.8
Neutrophils Relative %: 68

## 2011-09-22 LAB — D-DIMER, QUANTITATIVE: D-Dimer, Quant: 0.74 — ABNORMAL HIGH

## 2012-05-31 ENCOUNTER — Other Ambulatory Visit: Payer: Self-pay | Admitting: Family Medicine

## 2012-05-31 DIAGNOSIS — Z1231 Encounter for screening mammogram for malignant neoplasm of breast: Secondary | ICD-10-CM

## 2012-06-12 ENCOUNTER — Ambulatory Visit
Admission: RE | Admit: 2012-06-12 | Discharge: 2012-06-12 | Disposition: A | Discharge status: Home / Self Care | Payer: Medicaid Other | Source: Ambulatory Visit | Attending: Family Medicine | Admitting: Family Medicine

## 2012-06-12 DIAGNOSIS — Z1231 Encounter for screening mammogram for malignant neoplasm of breast: Secondary | ICD-10-CM

## 2012-06-12 IMAGING — MG MM DIGITAL SCREENING BILAT W/ CAD
4 series · 4 of 4 positions shown · non-contrast
Comparison: Previous exams

CLINICAL DATA: Screening.

DIGITAL SCREENING MAMMOGRAM WITH CAD

[L CC]
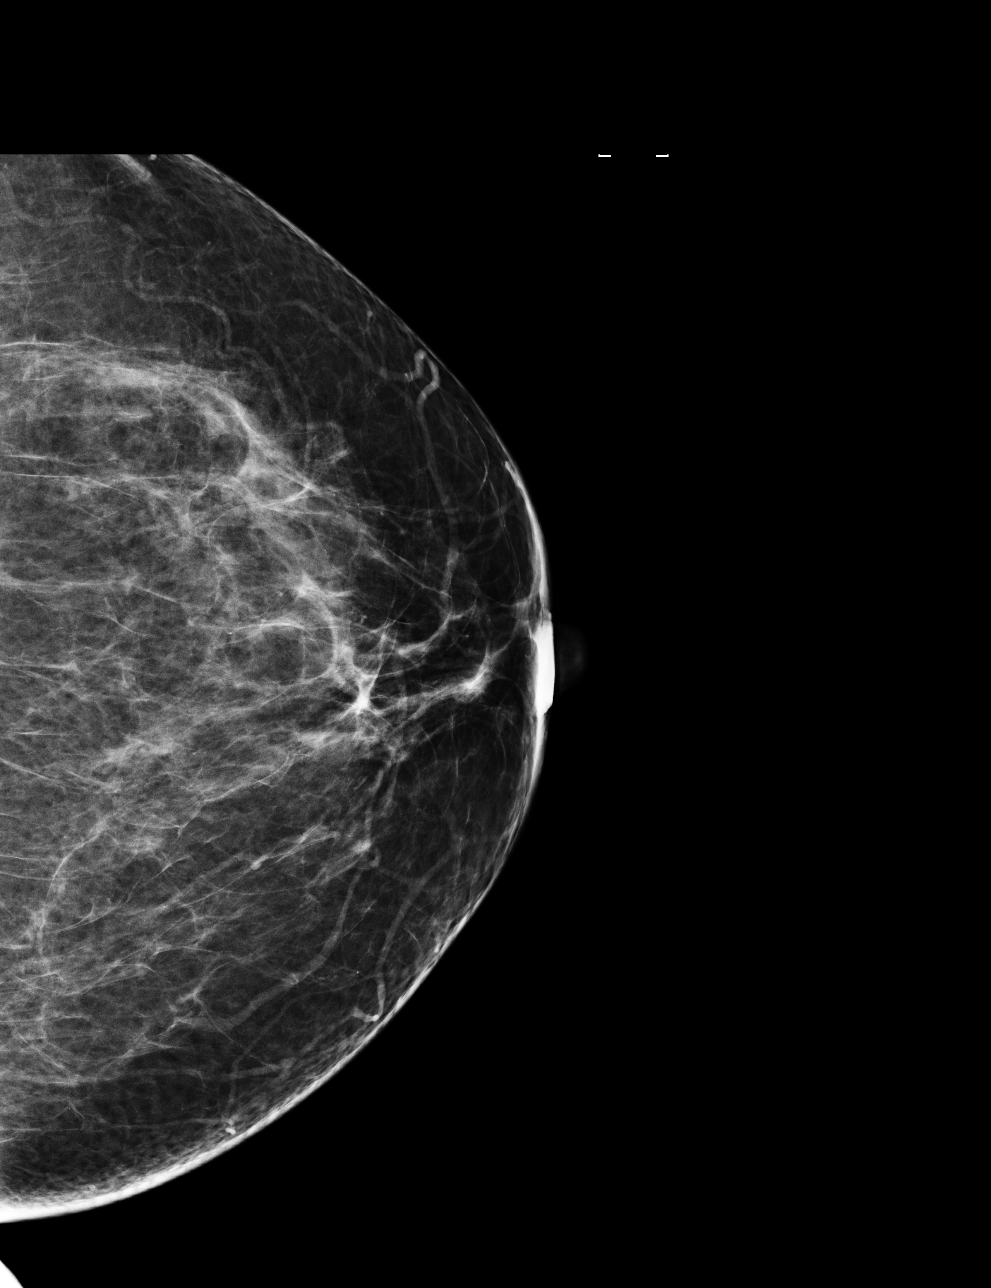

[R CC]
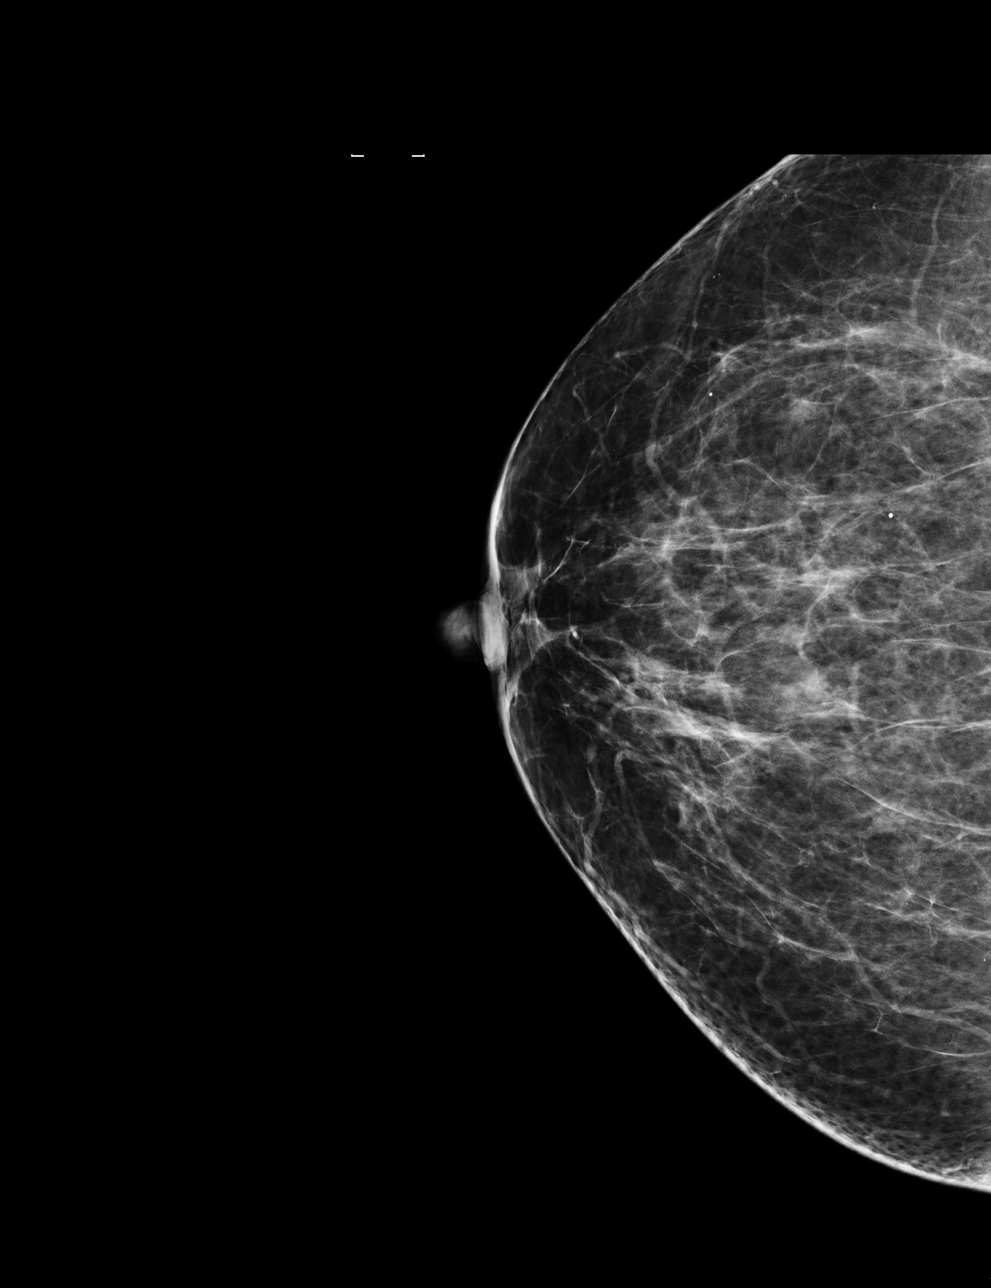

[L MLO]
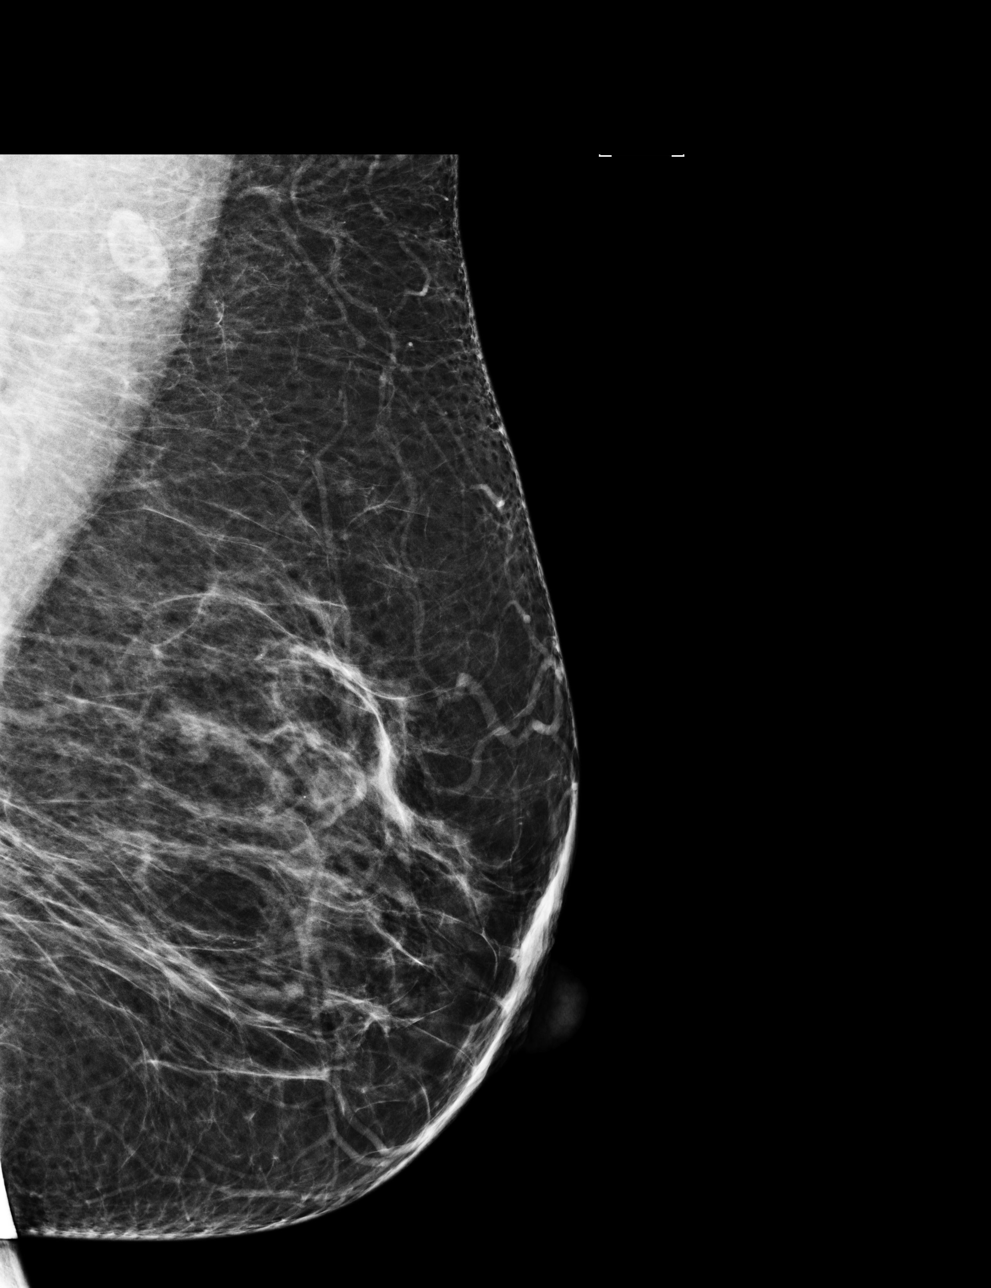

[R MLO]
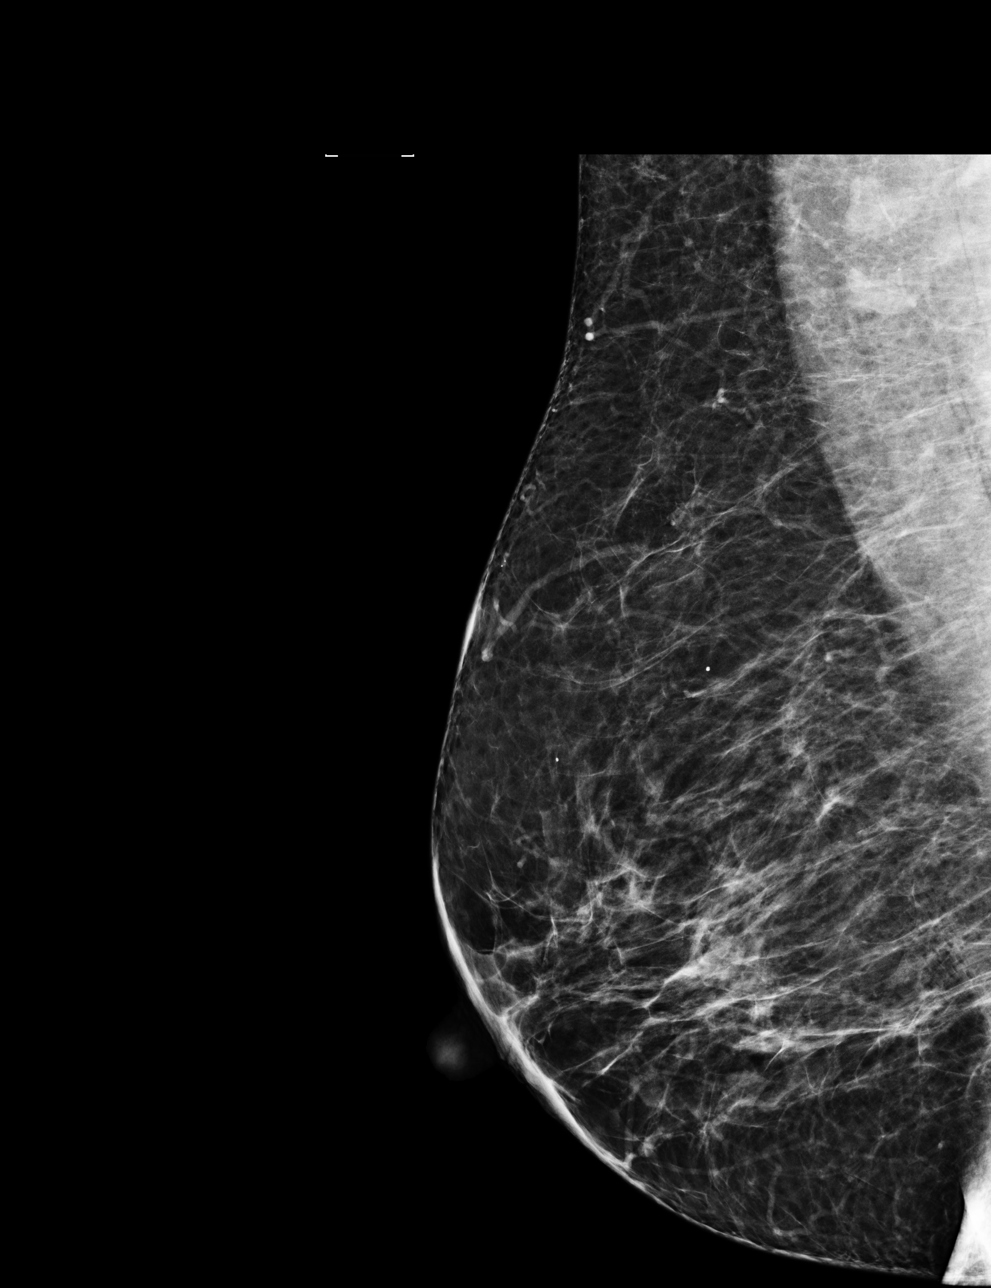

[4 of 4 positions shown; findings below may reference images not displayed]

FINDINGS: There are scattered fibroglandular densities. No
suspicious masses, architectural distortion, or calcifications are
present.

Images were processed with CAD.
IMPRESSION: No specific mammographic evidence of malignancy.

A result letter of this screening mammogram will be mailed directly
to the patient.

RECOMMENDATION:
Screening mammogram in one year. (Code:[C2])

BI-RADS CATEGORY 1:  Negative

## 2012-12-23 ENCOUNTER — Emergency Department (HOSPITAL_COMMUNITY)
Admission: EM | Admit: 2012-12-23 | Discharge: 2012-12-24 | Disposition: A | Discharge status: Home / Self Care | Payer: Medicaid Other | Attending: Emergency Medicine | Admitting: Emergency Medicine

## 2012-12-23 ENCOUNTER — Encounter (HOSPITAL_COMMUNITY): Payer: Self-pay | Admitting: *Deleted

## 2012-12-23 DIAGNOSIS — D869 Sarcoidosis, unspecified: Secondary | ICD-10-CM | POA: Insufficient documentation

## 2012-12-23 DIAGNOSIS — I4891 Unspecified atrial fibrillation: Secondary | ICD-10-CM | POA: Insufficient documentation

## 2012-12-23 DIAGNOSIS — R042 Hemoptysis: Secondary | ICD-10-CM | POA: Insufficient documentation

## 2012-12-23 HISTORY — DX: Sarcoidosis, unspecified: D86.9

## 2012-12-23 HISTORY — DX: Unspecified atrial fibrillation: I48.91

## 2012-12-23 NOTE — ED Notes (Signed)
Pt c/o upper abd pain 

## 2012-12-23 NOTE — ED Notes (Signed)
Pt states woke up this morning coughing up small amount of mucus; states two hours ago coughed up small amt bright red blood; started having hot flashes; c/o feeling nauseated;

## 2012-12-24 ENCOUNTER — Emergency Department (HOSPITAL_COMMUNITY): Payer: Medicaid Other

## 2012-12-24 LAB — CBC WITH DIFFERENTIAL/PLATELET
Basophils Absolute: 0 10*3/uL (ref 0.0–0.1)
Eosinophils Absolute: 0.1 10*3/uL (ref 0.0–0.7)
Eosinophils Relative: 2 % (ref 0–5)
HCT: 40.6 % (ref 36.0–46.0)
Lymphocytes Relative: 43 % (ref 12–46)
MCH: 28.8 pg (ref 26.0–34.0)
MCHC: 34.2 g/dL (ref 30.0–36.0)
MCV: 84.1 fL (ref 78.0–100.0)
Monocytes Absolute: 0.4 10*3/uL (ref 0.1–1.0)
Platelets: 184 10*3/uL (ref 150–400)
RDW: 13.2 % (ref 11.5–15.5)
WBC: 6.2 10*3/uL (ref 4.0–10.5)

## 2012-12-24 LAB — COMPREHENSIVE METABOLIC PANEL
AST: 38 U/L — ABNORMAL HIGH (ref 0–37)
CO2: 22 mEq/L (ref 19–32)
Calcium: 9.6 mg/dL (ref 8.4–10.5)
Creatinine, Ser: 0.57 mg/dL (ref 0.50–1.10)
GFR calc Af Amer: >90 mL/min (ref >90)
GFR calc non Af Amer: >90 mL/min (ref >90)
Total Protein: 7.8 g/dL (ref 6.0–8.3)

## 2012-12-24 LAB — URINALYSIS, ROUTINE W REFLEX MICROSCOPIC
Hgb urine dipstick: NEGATIVE
Nitrite: NEGATIVE
Specific Gravity, Urine: 1.024 (ref 1.005–1.030)
Urobilinogen, UA: 0.2 mg/dL (ref 0.0–1.0)
pH: 5 (ref 5.0–8.0)

## 2012-12-24 LAB — TROPONIN I: Troponin I: <0.3 ng/mL (ref <0.30)

## 2012-12-24 LAB — POCT I-STAT TROPONIN I: Troponin i, poc: 0 ng/mL (ref 0.00–0.08)

## 2012-12-24 IMAGING — CT CT ANGIO CHEST
1 of 2 series · 19 of 32 positions shown · IV contrast ([ID] OMNI 350)
Comparison: [DATE]

CLINICAL DATA: Sternal chest pain radiating down the left arm.
Sweating.  Hemoptysis.

CT ANGIOGRAPHY CHEST
TECHNIQUE: Multidetector CT imaging of the chest using the
standard protocol during bolus administration of intravenous
contrast. Multiplanar reconstructed images including MIPs were
obtained and reviewed to evaluate the vascular anatomy.
Contrast: 100mL OMNIPAQUE IOHEXOL 350 MG/ML SOLN

[Series 5: thins for pacs · axial · 0.59mm/px · z∈[+1566,+1756]mm · 19 of 212 slices shown]
[im 11/212  lung]
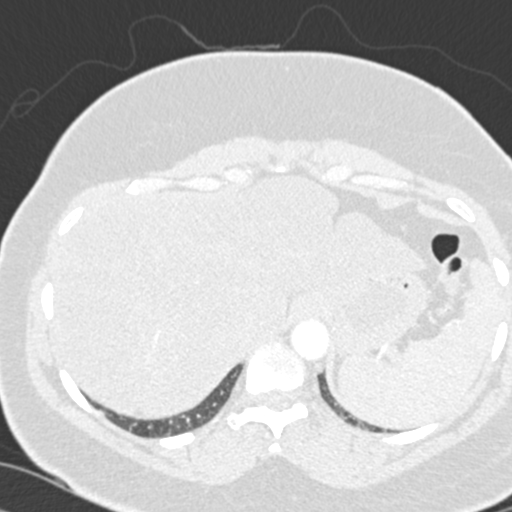
[im 22/212  mediastinal]
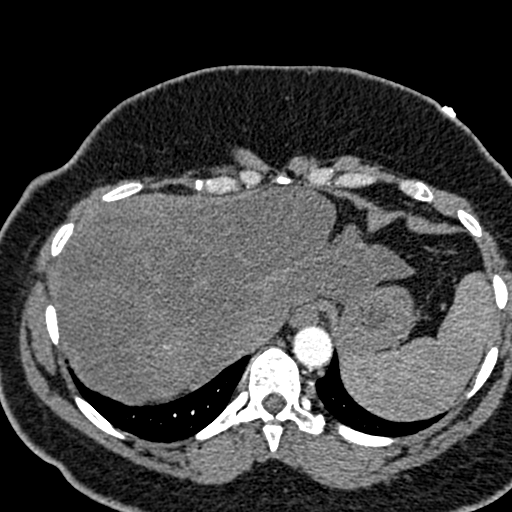
[im 32/212  lung]
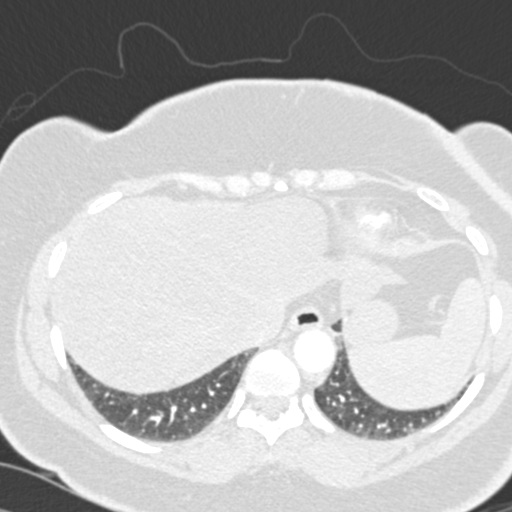
[im 53/212  mediastinal]
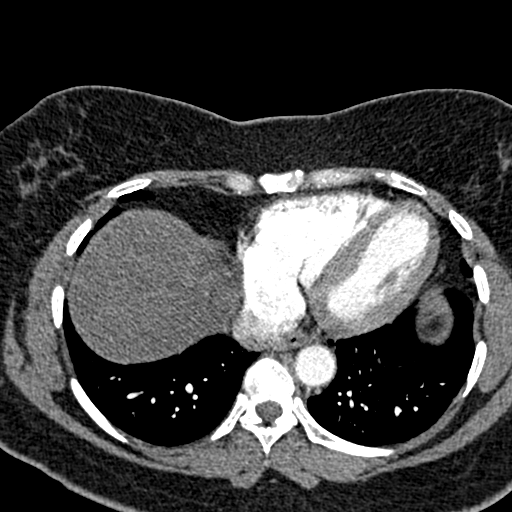
[im 64/212  lung]
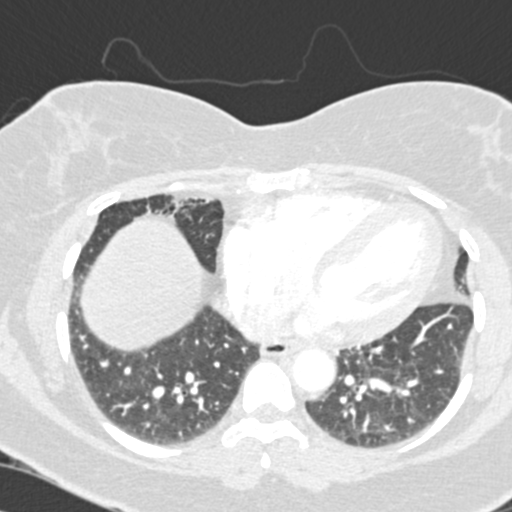
[im 71/212  mediastinal]
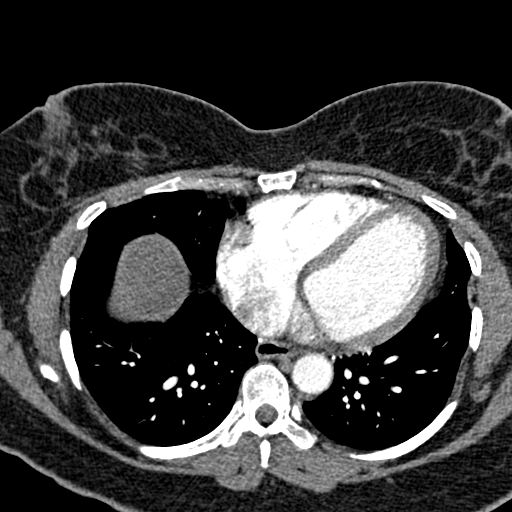
[im 74/212  lung]
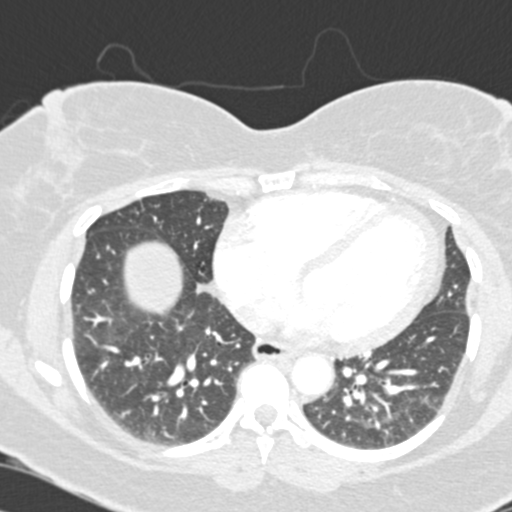
[im 85/212  mediastinal]
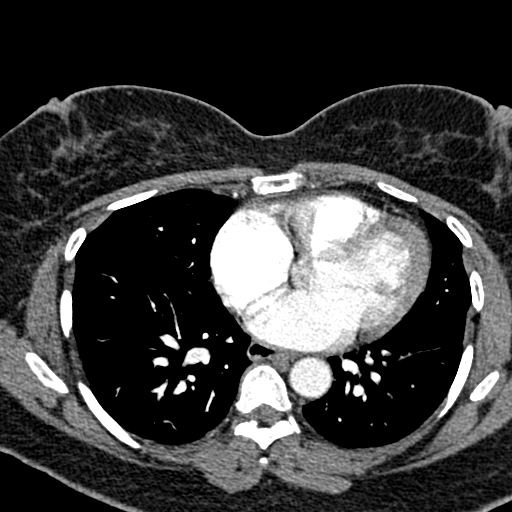
[im 95/212  lung]
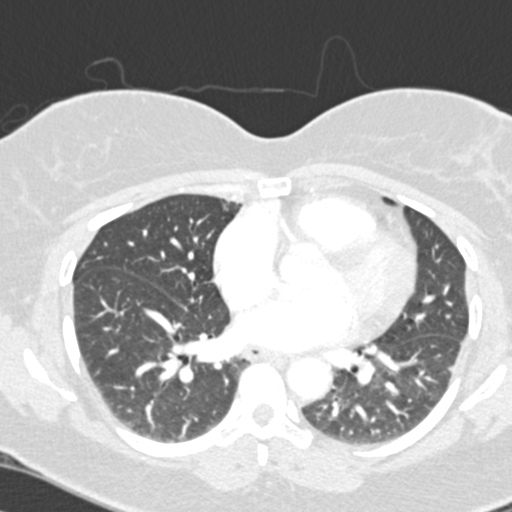
[im 106/212  mediastinal]
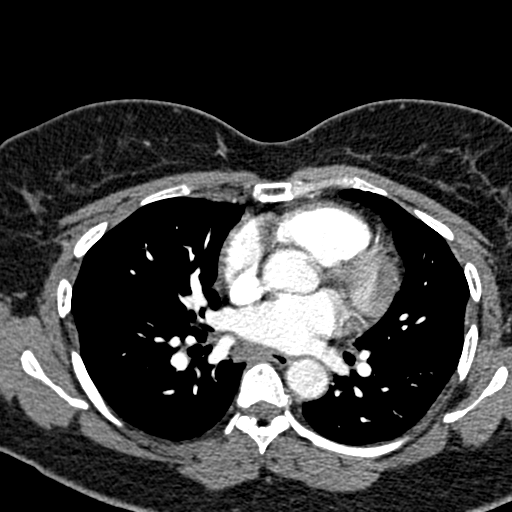
[im 117/212  lung]
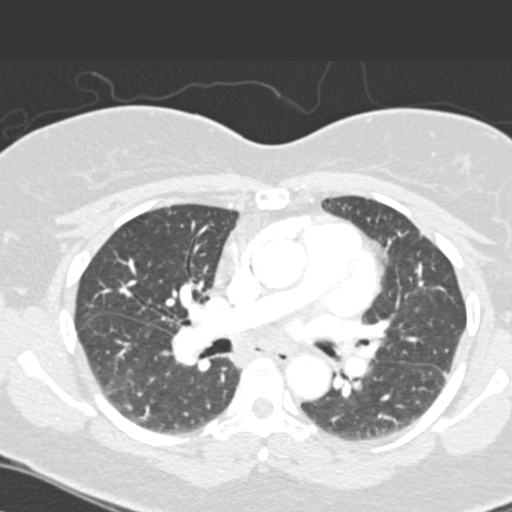
[im 127/212  mediastinal]
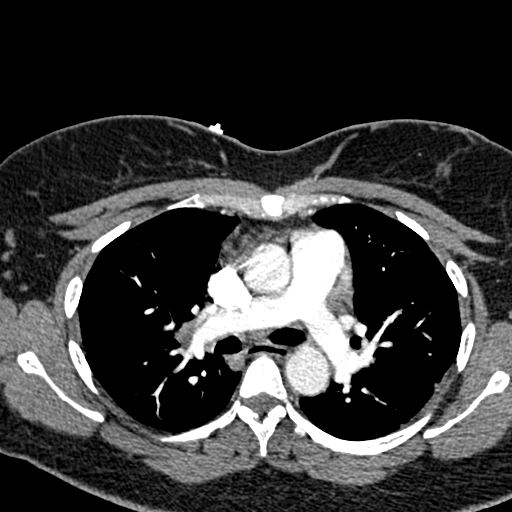
[im 138/212  lung]
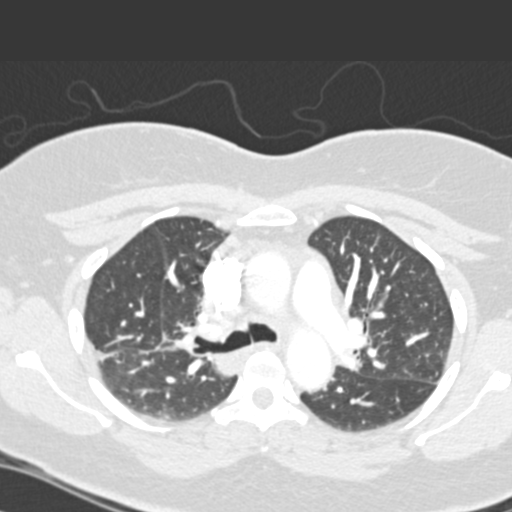
[im 141/212  mediastinal]
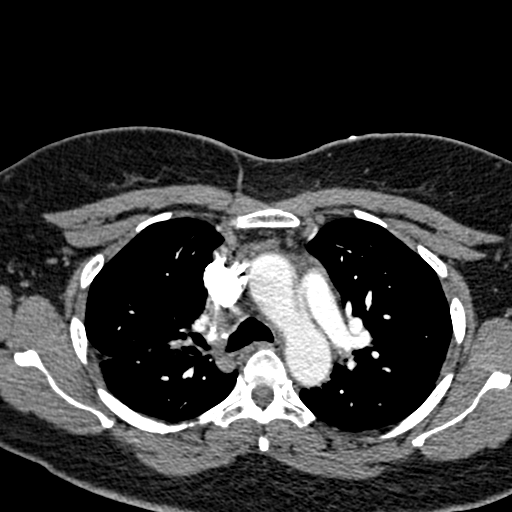
[im 148/212  lung]
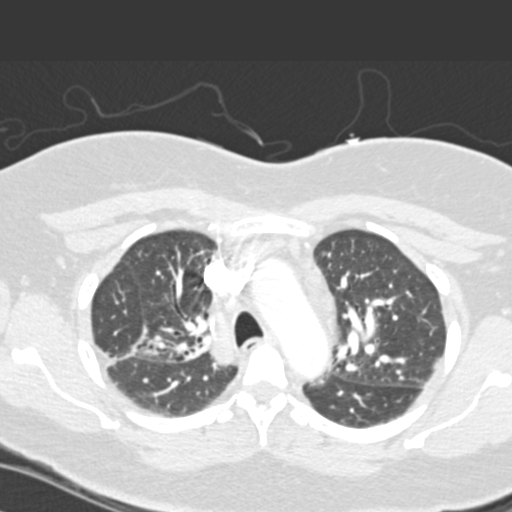
[im 159/212  mediastinal]
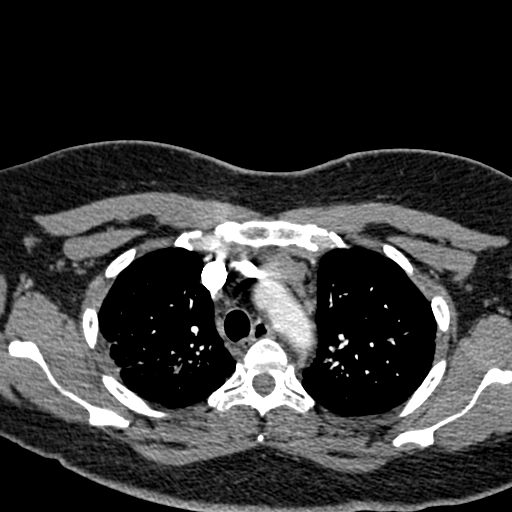
[im 180/212  lung]
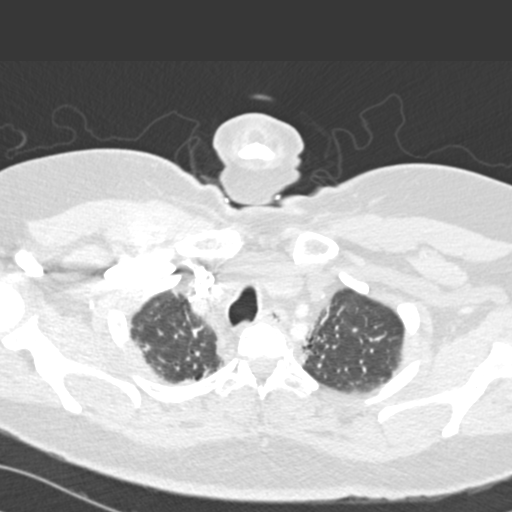
[im 190/212  mediastinal]
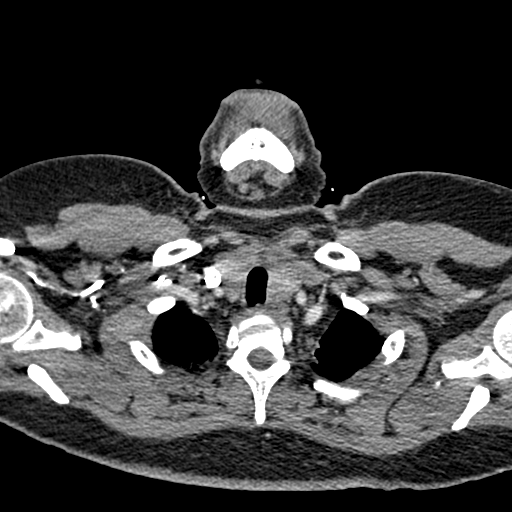
[im 201/212  lung]
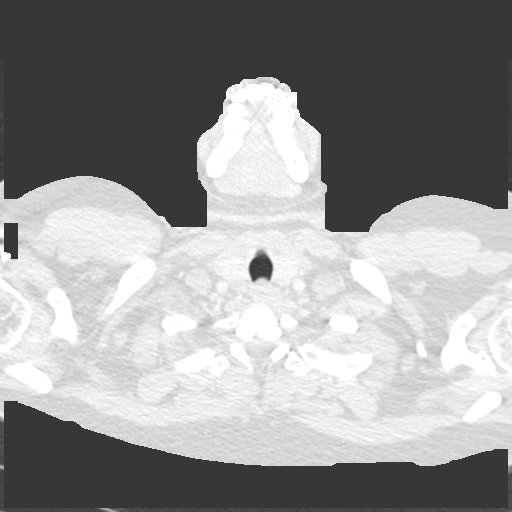

[19 of 32 positions shown; findings below may reference images not displayed]

FINDINGS: Technically adequate study with good opacification of the
central and segmental pulmonary arteries.  No focal filling
defects.  No evidence of significant pulmonary embolus.

Normal heart size.  Normal caliber thoracic aorta.  The esophagus
is not distended.  Hilar and mediastinal lymph nodes are not
pathologically enlarged.  1.7 cm nodule rising inferior to the left
lobe of the thyroid gland. Fatty infiltration of the liver.  No
pleural effusions.

There is bronchiectasis and scarring in the lung apices bilaterally
but more prominently on the right.  Airways appear patent without
mucus plugging.  No focal consolidation.  Mild dependent
atelectasis in the lung bases.  No pneumothorax.  Normal alignment
of the thoracic vertebrae.  No significant change since previous
study.
IMPRESSION: No evidence of significant pulmonary embolus.  Bronchiectasis and
scarring in the upper lungs.  No focal consolidation.  Fatty
infiltration of the liver.

## 2012-12-24 IMAGING — CR DG CHEST 2V
2 series · 2 of 2 positions shown · non-contrast
Comparison: [DATE]

CLINICAL DATA: Productive cough for 3 days.  Coughing up blood.
History of sarcoidosis.

CHEST - 2 VIEW

[w chest pa]
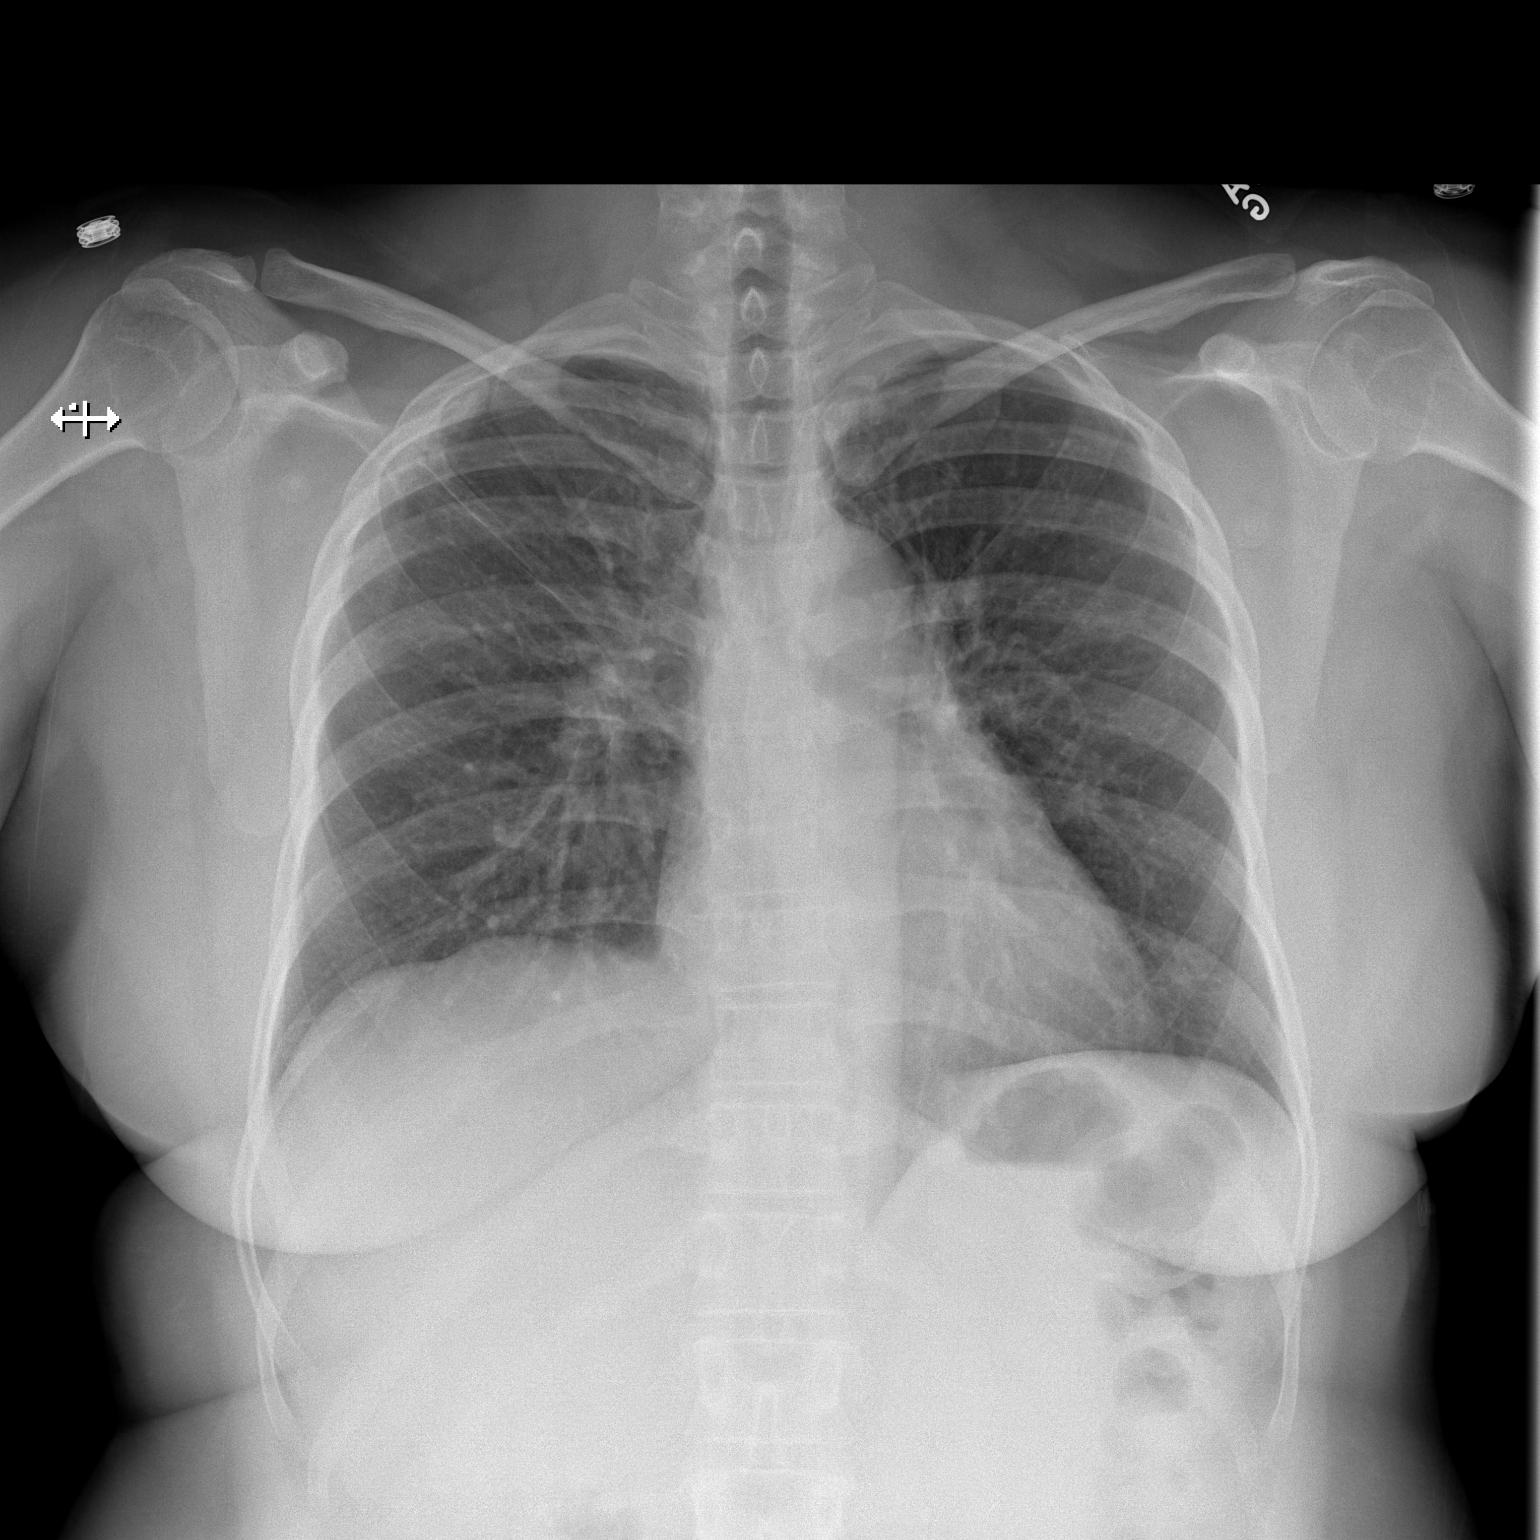

[w chest lat]
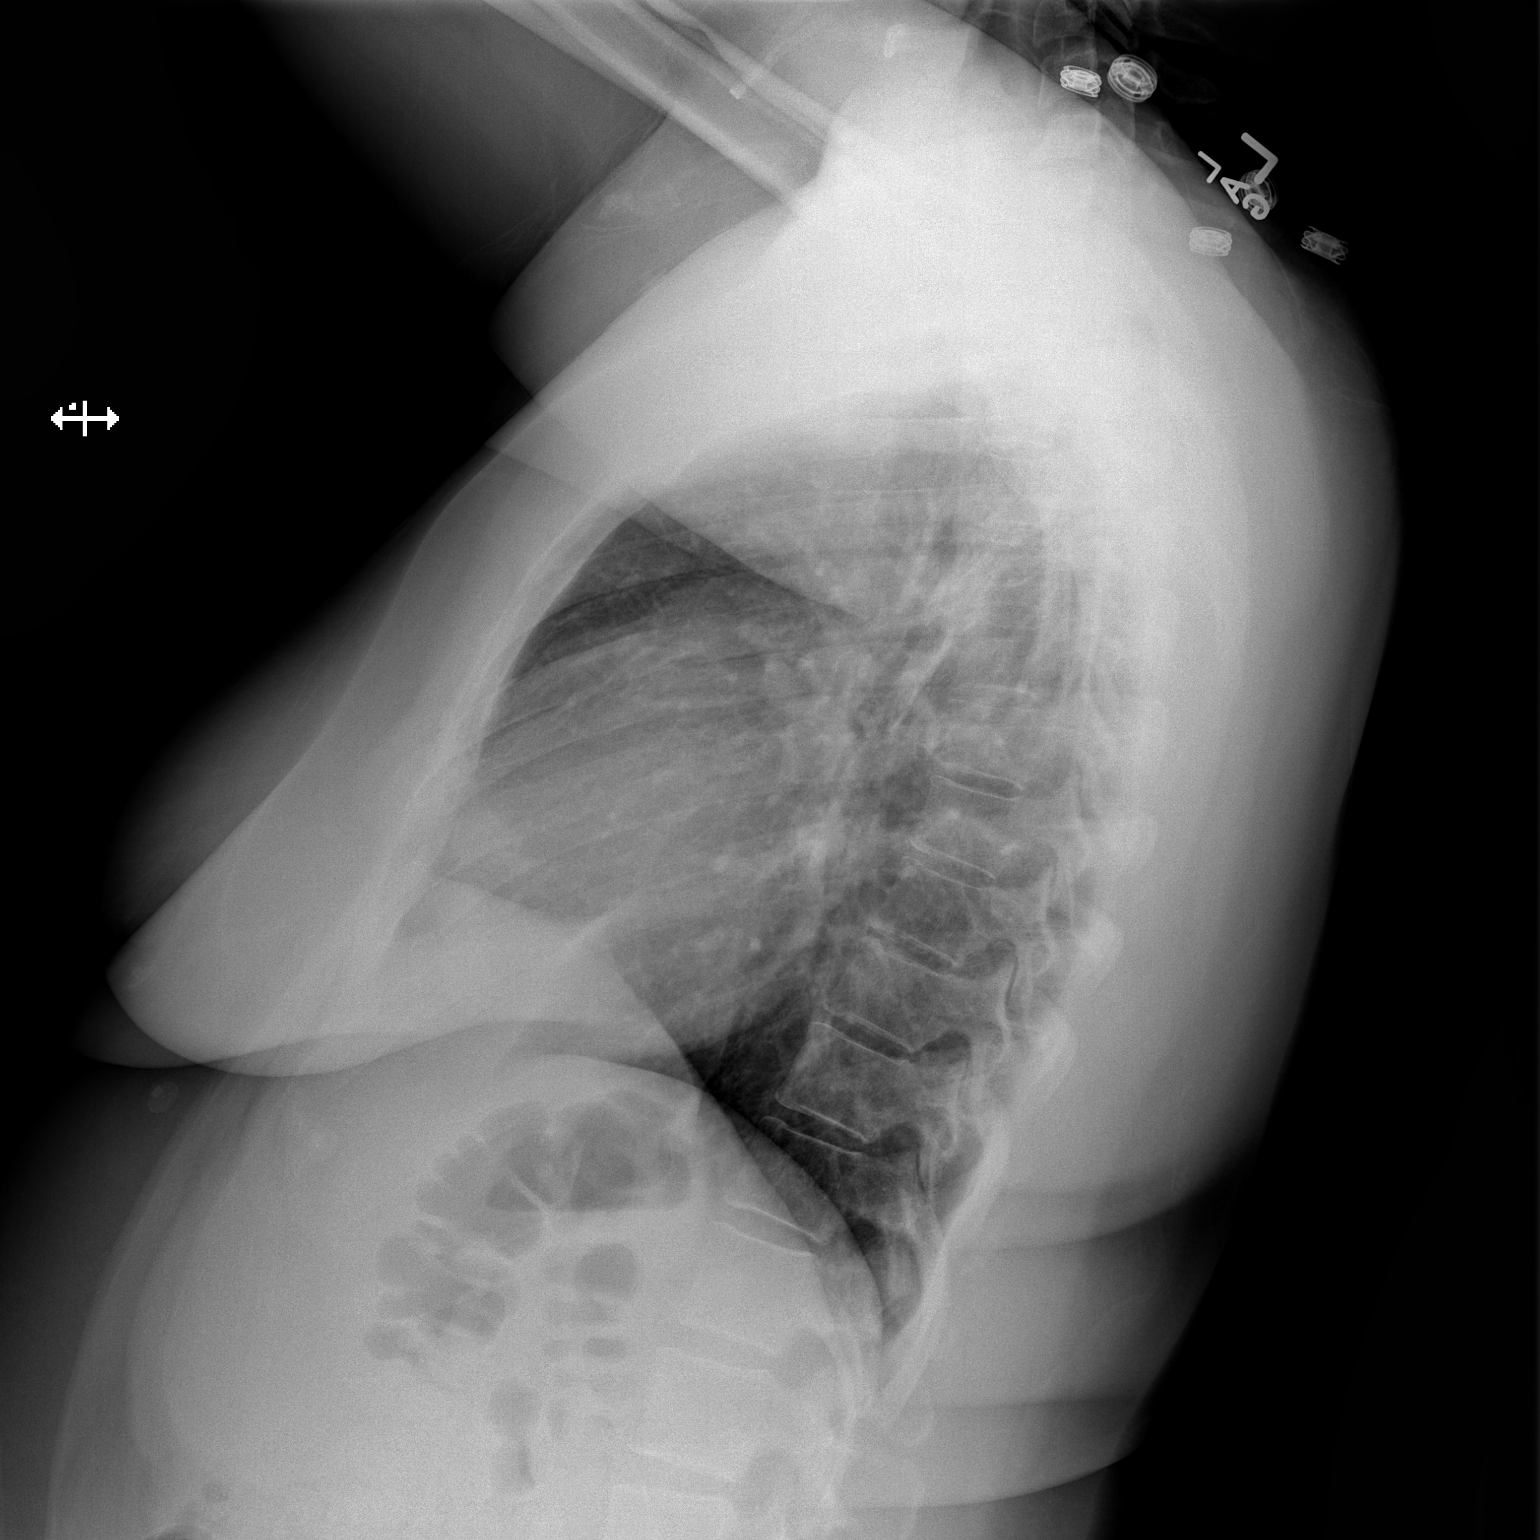

[2 of 2 positions shown; findings below may reference images not displayed]

FINDINGS: Slightly shallow inspiration with some elevation of right
hemidiaphragm.  Normal heart size and pulmonary vascularity.
Perihilar and right upper lung scarring which appears stable since
the previous study.  No focal airspace consolidation.  No blunting
of costophrenic angles.  No pneumothorax.  Mediastinal contours
appear intact.
IMPRESSION: Stable right upper lung and perihilar scarring.  No evidence of
active pulmonary disease.

## 2012-12-24 MED ORDER — IOHEXOL 350 MG/ML SOLN
100.0000 mL | Freq: Once | INTRAVENOUS | Status: AC | PRN
  Administered 2012-12-24: 100 mL via INTRAVENOUS

## 2012-12-24 NOTE — ED Notes (Signed)
Pt in radiology at this time. 

## 2012-12-24 NOTE — ED Provider Notes (Signed)
History     CSN: [id number]  Arrival date & time 12/23/12  2247   First MD Initiated Contact with Patient 12/24/12 0128      Chief Complaint  Patient presents with  . Hemoptysis    (Consider location/radiation/quality/duration/timing/severity/associated sxs/prior treatment) HPI History provided by pt.   Pt has h/o sarcoidosis.  Developed a cough yesterday morning, and at approx 11:30 last night, she had a single episode of hemoptysis, filling the entire palm of hand w/ blood.  No prior history.  No RF for PE but has had pain in right calf w/ associated mild edema for the past week.  Also c/o 25-30 min episode of self-limited, non-exertional, non-radiating, central CP w/ associated tachycardia and sensation of hot flash.  Has not had SOB.  Does not smoke cigarettes and no FH of MI.  Past Medical History  Diagnosis Date  . Atrial fibrillation   . Sarcoidosis     Past Surgical History  Procedure Date  . Abdominal hysterectomy   . Breast fibroadenoma surgery     No family history on file.  History  Substance Use Topics  . Smoking status: Never Smoker   . Smokeless tobacco: Not on file  . Alcohol Use: Yes     Comment: social    OB History    Grav Para Term Preterm Abortions TAB SAB Ect Mult Living                  Review of Systems  All other systems reviewed and are negative.    Allergies  Digoxin; Penicillins; and Lactose intolerance (gi)  Home Medications   Current Outpatient Rx  Name  Route  Sig  Dispense  Refill  . NEOMYCIN-POLYMYXIN-HC 3.5-10000-1 OP SUSP   Right Eye   Place 1 drop into the right eye 3 (three) times daily. For 3 days           BP 136/60  Pulse 73  Temp 98.6 F (37 C) (Oral)  Resp 20  SpO2 99%  Physical Exam  Nursing note and vitals reviewed. Constitutional: She is oriented to person, place, and time. She appears well-developed and well-nourished. No distress.  HENT:  Head: Normocephalic and atraumatic.  Eyes:       Normal  appearance  Neck: Normal range of motion.  Cardiovascular: Normal rate, regular rhythm and intact distal pulses.   Pulmonary/Chest: Effort normal and breath sounds normal. No respiratory distress. She exhibits no tenderness.       No pleuritic pain reported  Abdominal: Soft. Bowel sounds are normal. She exhibits no distension. There is no tenderness. There is no guarding.  Musculoskeletal: Normal range of motion.       No peripheral edema or calf tenderness  Neurological: She is alert and oriented to person, place, and time.  Skin: Skin is warm and dry. No rash noted.  Psychiatric: She has a normal mood and affect. Her behavior is normal.    ED Course  Procedures (including critical care time)   Date: 12/24/2012  Rate: 72  Rhythm: normal sinus rhythm and trigeminy  QRS Axis: normal  Intervals: normal  ST/T Wave abnormalities: normal  Conduction Disutrbances:none  Narrative Interpretation:   Old EKG Reviewed: changes noted (PVCs; pt has prior history)  Labs Reviewed  COMPREHENSIVE METABOLIC PANEL - Abnormal; Notable for the following:    Sodium 134 (*)     Glucose, Bld 115 (*)     AST 38 (*)     ALT 50 (*)  All other components within normal limits  URINALYSIS, ROUTINE W REFLEX MICROSCOPIC - Abnormal; Notable for the following:    APPearance HAZY (*)     All other components within normal limits  CBC WITH DIFFERENTIAL  TROPONIN I  POCT I-STAT TROPONIN I   Dg Chest 2 View  12/24/2012  *RADIOLOGY REPORT*  Clinical Data: Productive cough for 3 days.  Coughing up blood. History of sarcoidosis.  CHEST - 2 VIEW  Comparison: 10/16/2008  Findings: Slightly shallow inspiration with some elevation of right hemidiaphragm.  Normal heart size and pulmonary vascularity. Perihilar and right upper lung scarring which appears stable since the previous study.  No focal airspace consolidation.  No blunting of costophrenic angles.  No pneumothorax.  Mediastinal contours appear intact.   IMPRESSION: Stable right upper lung and perihilar scarring.  No evidence of active pulmonary disease.   Original Report Authenticated By: Burman Nieves, M.D.    Ct Angio Chest Pe W/cm &/or Wo Cm  12/24/2012  *RADIOLOGY REPORT*  Clinical Data: Sternal chest pain radiating down the left arm. Sweating.  Hemoptysis.  CT ANGIOGRAPHY CHEST  Technique:  Multidetector CT imaging of the chest using the standard protocol during bolus administration of intravenous contrast. Multiplanar reconstructed images including MIPs were obtained and reviewed to evaluate the vascular anatomy.  Contrast: OMNIPAQUE IOHEXOL 350 MG/ML SOLN  Comparison: 09/09/2007  Findings: Technically adequate study with good opacification of the central and segmental pulmonary arteries.  No focal filling defects.  No evidence of significant pulmonary embolus.  Normal heart size.  Normal caliber thoracic aorta.  The esophagus is not distended.  Hilar and mediastinal lymph nodes are not pathologically enlarged.  1.7 cm nodule rising inferior to the left lobe of the thyroid gland. Fatty infiltration of the liver.  No pleural effusions.  There is bronchiectasis and scarring in the lung apices bilaterally but more prominently on the right.  Airways appear patent without mucus plugging.  No focal consolidation.  Mild dependent atelectasis in the lung bases.  No pneumothorax.  Normal alignment of the thoracic vertebrae.  No significant change since previous study.  IMPRESSION: No evidence of significant pulmonary embolus.  Bronchiectasis and scarring in the upper lungs.  No focal consolidation.  Fatty infiltration of the liver.   Original Report Authenticated By: Burman Nieves, M.D.      1. Hemoptysis       MDM  [phone] F w/ h/o sarcoidosis and frequent PVCs, otherwise healthy, presents w/ c/o single episode of hemoptysis last night (cough x 1 day) followed by a episode of CP an hour later.  Doubt ACS; TIMI 0, pain atypical and is more  consistent w/ panic attack, particularly in setting of anxiety over coughing up blood, EKG non-ischemic and serial troponins neg.  Recommended f/u with her cardiologist this week.  CTA chest ordered to r/o PE, particularly because CXR neg and pt complained of RLE edema/pain earlier in week, and is negative for acute pathology.  Results discussed w/ pt and she has been reassured.  Referred back to her pulmonologist.  Return precautions discussed.      Otilio Miu, PA-C 12/24/12 [id number]

## 2012-12-24 NOTE — ED Notes (Signed)
IV attempted x1, pt did not tolerate well and requesting this RN to not continue, explained to patient that needle has just pierced skin, pt stated needle hurt and to remove, RN complied, charge RN to bedside to attempt.

## 2012-12-24 NOTE — ED Notes (Signed)
Pt having blood drawn and stated to phlebotomist that she had told nurse at check in she was having chest pain and nurse had not done anything about it; pt had not told this undersigned nurse about chest pain; pt moved to triage one and ekg done immediately when patient questioned; pt states she has had chest pain on and off; requesting "troponin" be drawn

## 2012-12-24 NOTE — ED Notes (Signed)
Patient reports approx 3 hours ago she was having pain in her chest, radiating down her left arm, and that she broke into a sweat. Patient denies this same chest pain now but is reporting discomfort 2/10 in her sternum. Patient also reports that she began coughing up blood, patient states she had a small cough and tasted blood in her mouth so she coughed again more forcefully and coughed up bright red blood into her hand, enough to fill her palm. Patient states she has an irregular heartbeat, with PVCs, and a history of sarcoidosis. Patient reports having a full cardiac workup about 3 months ago Dr Verdis Prime at [facility].

## 2012-12-26 NOTE — ED Provider Notes (Signed)
Medical screening examination/treatment/procedure(s) were performed by non-physician practitioner and as supervising physician I was immediately available for consultation/collaboration.   Calub Tarnow L Russie Gulledge, MD 12/26/12 2248 

## 2013-07-04 ENCOUNTER — Other Ambulatory Visit: Payer: Self-pay | Admitting: Family Medicine

## 2013-07-04 DIAGNOSIS — N644 Mastodynia: Secondary | ICD-10-CM

## 2013-07-19 ENCOUNTER — Ambulatory Visit
Admission: RE | Admit: 2013-07-19 | Discharge: 2013-07-19 | Disposition: A | Discharge status: Home / Self Care | Payer: Medicaid Other | Source: Ambulatory Visit | Attending: Family Medicine | Admitting: Family Medicine

## 2013-07-19 DIAGNOSIS — N644 Mastodynia: Secondary | ICD-10-CM

## 2013-07-19 IMAGING — MG MM DIGITAL DIAGNOSTIC BILAT CAD
5 series · 5 of 5 positions shown · non-contrast
Comparison: Previous mammograms.

CLINICAL DATA: 52-year-old female with palpable abnormality and
pain in the high left axilla.

DIGITAL DIAGNOSTIC BILATERAL MAMMOGRAM  AND LEFT BREAST ULTRASOUND:

[R CC]
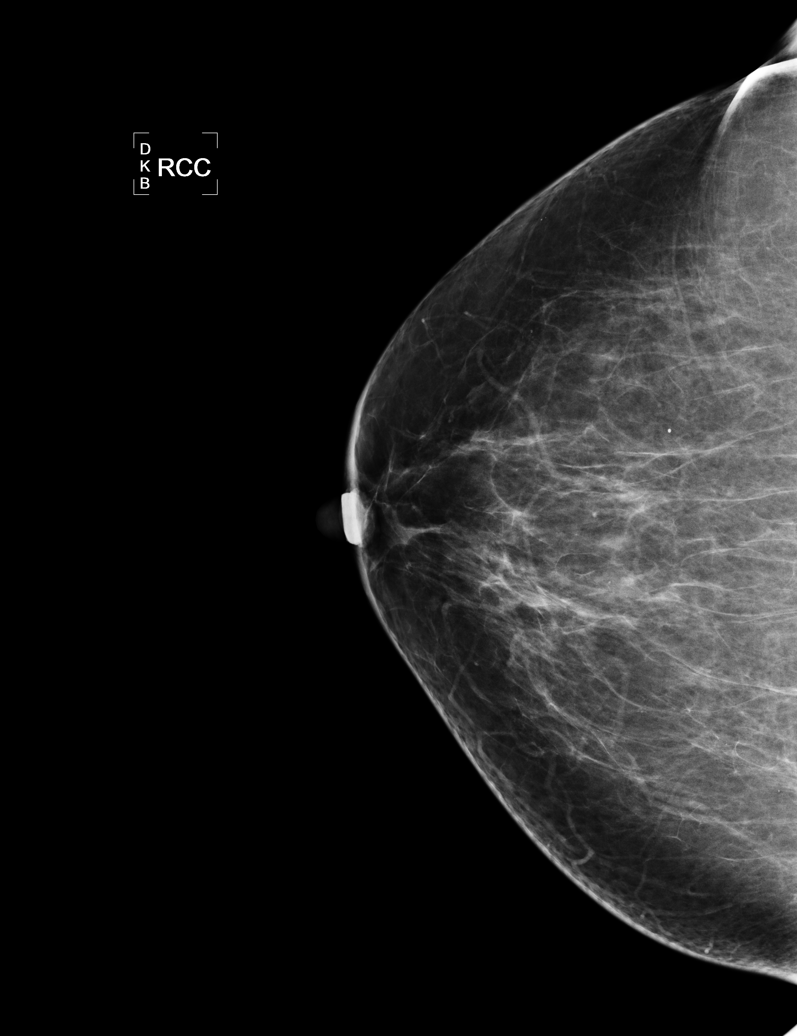

[L CC]
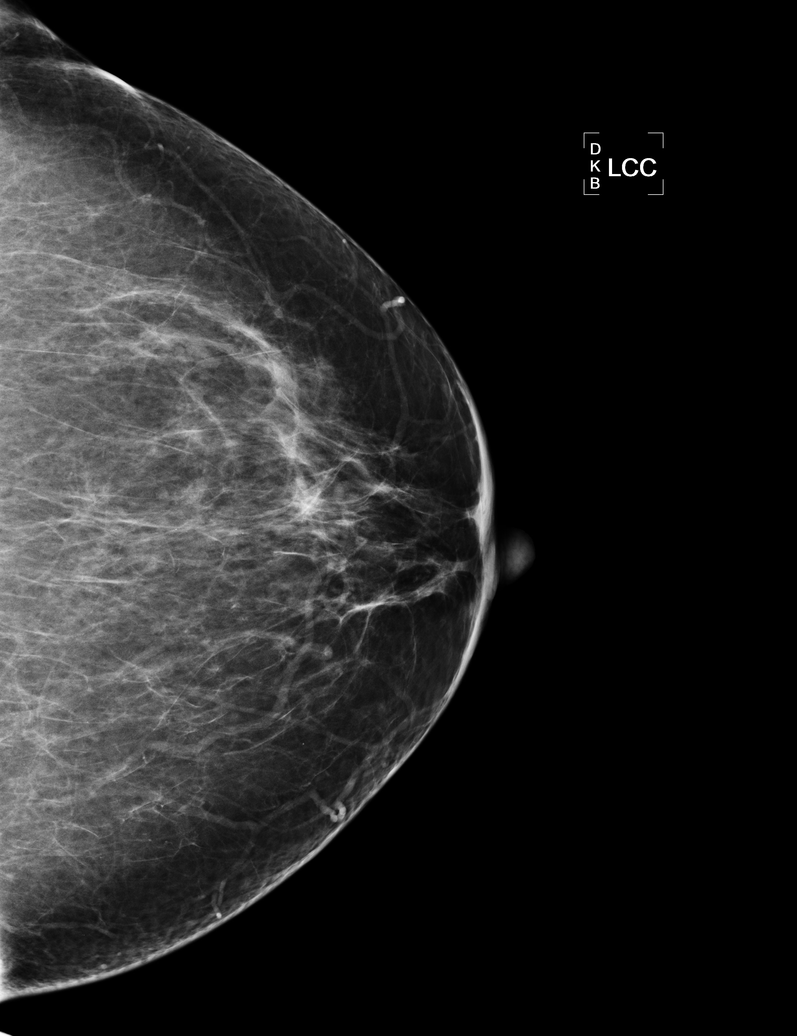

[L MLO]
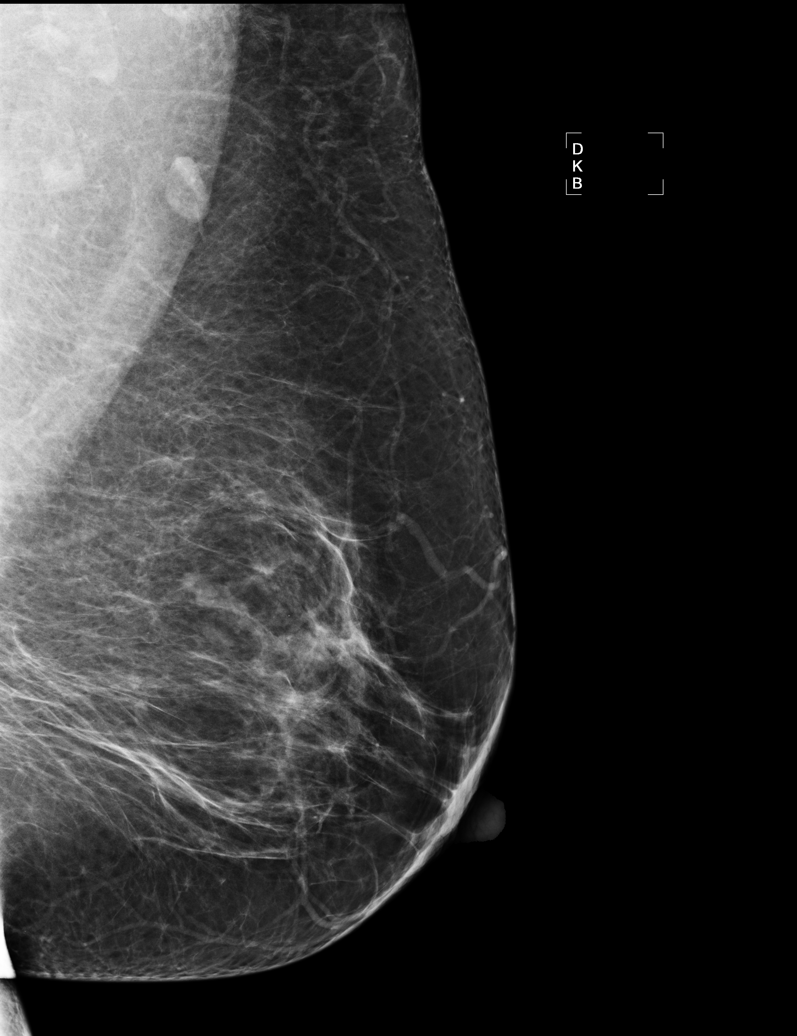

[R MLO]
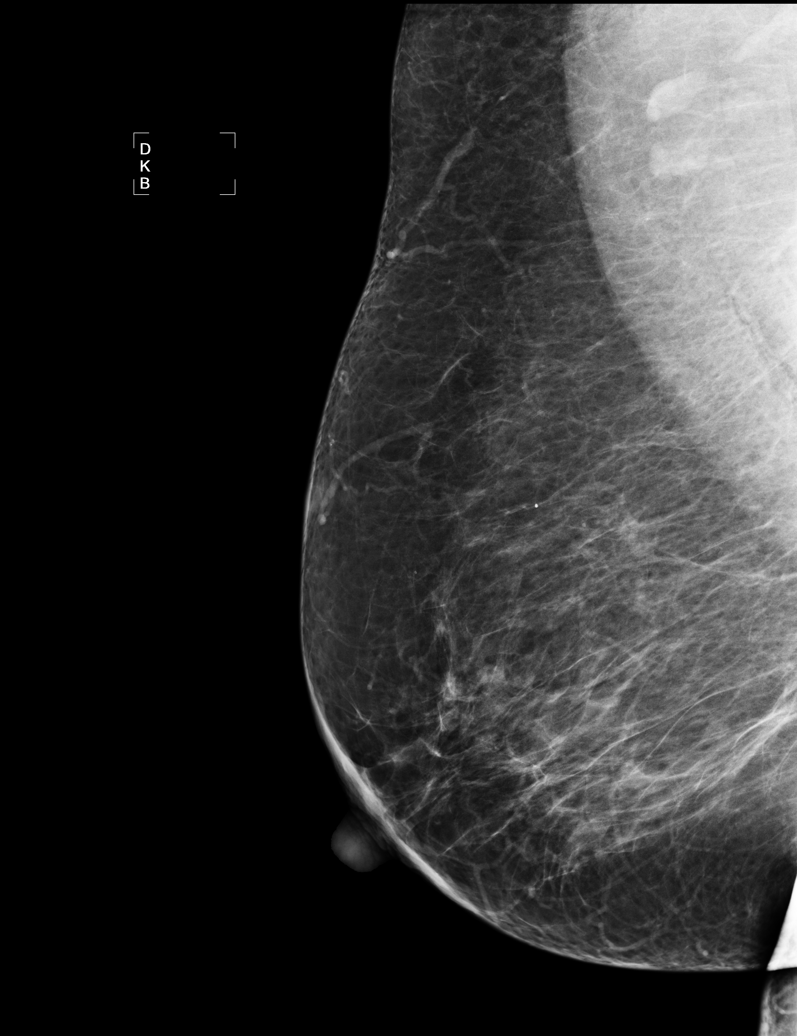

[L AT]
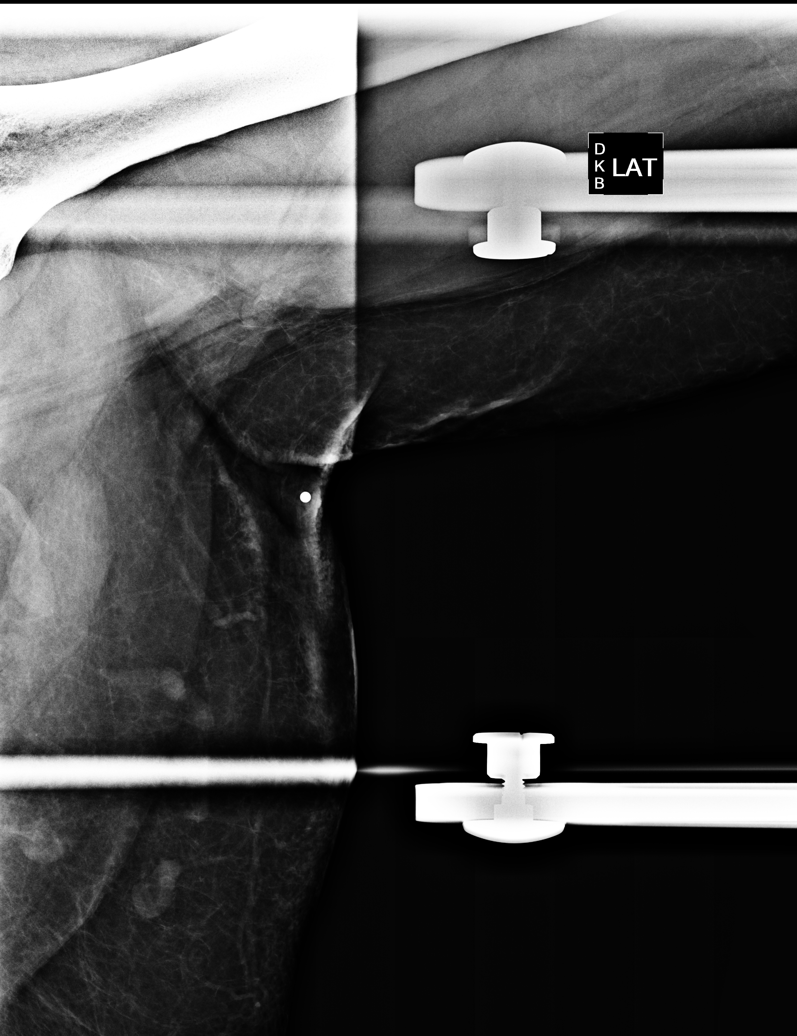

[5 of 5 positions shown; findings below may reference images not displayed]

FINDINGS: ACR Breast Density Category b:  There are scattered areas of
fibroglandular density.

There are no suspicious masses or calcifications seen in either
breast.  Several lymph nodes are seen in the left axilla on the
spot compression view at the site of palpable concern on the left.
There is no mammographic evidence of malignancy in the left breast.

Mammographic images were processed with CAD.

Physical examination of the high left axilla at site of palpable
concern does not reveal any discrete palpable masses.

Targeted ultrasound of the high left axilla at site of palpable
concern was performed.  No discrete masses or abnormalities are
visualized, only normal-appearing tissue is seen.
IMPRESSION: 1. No suspicious abnormalities are seen at the site of palpable
concern in the high left axilla, only normal-appearing axillary
lymph nodes are seen mammographically.
2.  No mammographic evidence of malignancy in either breast.

RECOMMENDATION:

1.  Recommend further management/decisions of the palpable
abnormality in the left axilla be based on clinical grounds.
2.  Screening mammogram in one year. (Code:[2F])

I have discussed the findings and recommendations with the patient.
Results were also provided in writing at the conclusion of the
visit.  If applicable, a reminder letter will be sent to the
patient regarding the next appointment.

BI-RADS CATEGORY 1:  Negative.

## 2013-07-30 ENCOUNTER — Encounter (INDEPENDENT_AMBULATORY_CARE_PROVIDER_SITE_OTHER): Payer: Self-pay

## 2013-07-31 ENCOUNTER — Ambulatory Visit (INDEPENDENT_AMBULATORY_CARE_PROVIDER_SITE_OTHER): Payer: Medicaid Other | Admitting: Surgery

## 2013-07-31 ENCOUNTER — Encounter (INDEPENDENT_AMBULATORY_CARE_PROVIDER_SITE_OTHER): Payer: Self-pay | Admitting: Surgery

## 2013-07-31 VITALS — BP 141/80 | HR 82 | Resp 16 | Ht 62.0 in | Wt 216.0 lb

## 2013-07-31 DIAGNOSIS — K8020 Calculus of gallbladder without cholecystitis without obstruction: Secondary | ICD-10-CM

## 2013-07-31 NOTE — Progress Notes (Signed)
Patient ID: Jackie [name], female   DOB: [birth year], 52 y.o.   MRN: [id number]  Chief Complaint  Patient presents with  . Cholelithiasis    HPI Jackie [name] is a 52 y.o. female.  She was recently diagnosed with gallstones. She does have some issues with belching and it seems that eating greasy or fatty foods makes it worse but she's been recently trying to avoid those types of foods. She's never had a really bad episodes such that she had to go the emergency department. Because of her symptoms a gallbladder ultrasound was ordered which shows cholelithiasis without evidence of acute cholecystitis. Incidentally noted was a fatty liver. Surgical consultation has been requested.  HPI  Past Medical History  Diagnosis Date  . Atrial fibrillation   . Sarcoidosis     Past Surgical History  Procedure Laterality Date  . Abdominal hysterectomy    . Breast fibroadenoma surgery      No family history on file.  Social History History  Substance Use Topics  . Smoking status: Never Smoker   . Smokeless tobacco: Not on file  . Alcohol Use: Yes     Comment: social    Allergies  Allergen Reactions  . Digoxin Anaphylaxis    REACTION: severe bradycardia  . Penicillins Anaphylaxis    REACTION: Hives  . Lactose Intolerance (Gi)     Current Outpatient Prescriptions  Medication Sig Dispense Refill  . Cholecalciferol (VITAMIN D-3 PO) Take by mouth.      . Multiple Vitamin (MULTIVITAMIN) tablet Take 1 tablet by mouth daily.      Marland Kitchen neomycin-polymyxin-hydrocortisone (CORTISPORIN) 3.5-10000-1 ophthalmic suspension Place 1 drop into the right eye 3 (three) times daily. For 3 days       No current facility-administered medications for this visit.    Review of Systems Review of Systems  Constitutional: Negative for fever, chills and unexpected weight change.  HENT: Negative for hearing loss, congestion, sore throat, trouble swallowing and voice change.   Eyes: Negative for visual  disturbance.  Respiratory: Negative for cough and wheezing.   Cardiovascular: Positive for palpitations. Negative for chest pain and leg swelling.       Which are intermittent and which she relates to her history of sarcoidosis  Gastrointestinal: Positive for abdominal distention. Negative for nausea, vomiting, abdominal pain, diarrhea, constipation, blood in stool and anal bleeding.  Genitourinary: Negative for hematuria, vaginal bleeding and difficulty urinating.  Musculoskeletal: Negative for arthralgias.  Skin: Negative for rash and wound.  Neurological: Negative for seizures, syncope and headaches.  Hematological: Negative for adenopathy. Does not bruise/bleed easily.  Psychiatric/Behavioral: Negative for confusion.    Blood pressure 141/80, pulse 82, resp. rate 16, height 5\' 2"  (1.575 m), weight 216 lb (97.977 kg).  Physical Exam Physical Exam  Vitals reviewed. Constitutional: She is oriented to person, place, and time. She appears well-developed and well-nourished. No distress.  HENT:  Head: Normocephalic and atraumatic.  Mouth/Throat: Oropharynx is clear and moist.  Eyes: Conjunctivae and EOM are normal. Pupils are equal, round, and reactive to light. No scleral icterus.  Neck: Normal range of motion. Neck supple. No tracheal deviation present. No thyromegaly present.  Cardiovascular: Normal rate, regular rhythm, normal heart sounds and intact distal pulses.  Exam reveals no gallop and no friction rub.   No murmur heard. Pulmonary/Chest: Effort normal and breath sounds normal. No respiratory distress. She has no wheezes. She has no rales.  Abdominal: Soft. Bowel sounds are normal. She exhibits no distension and no  mass. There is no tenderness. There is no rebound and no guarding.  Musculoskeletal: Normal range of motion. She exhibits no edema and no tenderness.  Neurological: She is alert and oriented to person, place, and time.  Skin: Skin is warm and dry. No rash noted. She is  not diaphoretic. No erythema.  Psychiatric: She has a normal mood and affect. Her behavior is normal. Judgment and thought content normal.    Data Reviewed Sono and old record reviewed  Assessment    Gallstones, minimally symptomatic     Plan    I have discussed the indications for laparoscopic cholecystectomy with her and provided educational material. We have discussed the risks of surgery, including general risks such as bleeding, infection, lung and heart issues etc. We have also discussed the potential for injuries to other organs, bile duct leaks, and other unexpected events. We have also talked about the fact that this may need to be converted to open under certain circumstances. We discussed the typical post op recovery and the fact that there is a good likelihood of improvement in symptoms and return to normal activity.  She is minimally symptomatic and there is no strong indication for surgery at this time. She will think about her options and call if she wants to have surgery I believe all of her questions have been answered.           Valeria Krisko J 07/31/2013, 4:06 PM

## 2013-07-31 NOTE — Patient Instructions (Signed)
She develop more symptoms, or simply decided you want to have gallbladder surgery give [place] a call.

## 2014-04-29 ENCOUNTER — Encounter: Payer: Self-pay | Admitting: *Deleted

## 2014-04-29 ENCOUNTER — Encounter: Payer: Self-pay | Admitting: Pulmonary Disease

## 2014-04-29 ENCOUNTER — Other Ambulatory Visit: Payer: BC Managed Care – PPO

## 2014-04-29 ENCOUNTER — Encounter (INDEPENDENT_AMBULATORY_CARE_PROVIDER_SITE_OTHER): Payer: Self-pay

## 2014-04-29 ENCOUNTER — Ambulatory Visit (INDEPENDENT_AMBULATORY_CARE_PROVIDER_SITE_OTHER): Payer: BC Managed Care – PPO | Admitting: Pulmonary Disease

## 2014-04-29 VITALS — BP 144/78 | HR 78 | Ht 62.5 in | Wt 213.6 lb

## 2014-04-29 DIAGNOSIS — G4733 Obstructive sleep apnea (adult) (pediatric): Secondary | ICD-10-CM

## 2014-04-29 DIAGNOSIS — D869 Sarcoidosis, unspecified: Secondary | ICD-10-CM

## 2014-04-29 DIAGNOSIS — J479 Bronchiectasis, uncomplicated: Secondary | ICD-10-CM

## 2014-04-29 NOTE — Assessment & Plan Note (Signed)
Stable Discussed precautions for future

## 2014-04-29 NOTE — Progress Notes (Signed)
Subjective:    Patient ID: Jackie [name], female    DOB: 10/31/1961, 53 y.o.   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 She is seen Dr. Patsey Berthold in my practice, and was seen by Dr. Halford Chessman in 2009 for preop eval for hysterectomy. 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 She then had a bronchoscopy with biopsy which confirmed the diagnosis of sarcoid. She has not had any problems with this since 2000, and has not been on any specific therapy for her sarcoid since then. She did also have skin involvement with her sarcoid. There is no previous history of tuberculosis.   She had a CT chest on September 09, 2007 which showed bilateral upper lobe bronchiectasis.  Chest xrays show chronic scarring in the upper lobes bilaterally Spirometry in 10/2008 showed FVC of 90% predicted  She developed an episode of acute shortness of breath in the first week of April and went to the emergency room at [facility] on 03/17/14. She also reports seasonal allergies. She was treated for acute bronchitis and fluid in her ears with Z-Pak, prednisone, albuterol inhaler and Zyrtec. Chest x-ray showed scarring in the right upper lobe. CT chest without contrast showed bronchiectasis and volume loss in the right upper lobe and mild disease in the right middle lobe. Mild mediastinal lymphadenopathy was noted. I have reviewed these images She has been previously diagnosed with sleep apnea (RDI 23/h), and has been on CPAP at 9 cm but admits to noncompliance. She has gained some weight since.  She reports being about 50% better with her dyspnea, her voice is improved and she can sing, she reports clear white mucus and some burning in her left chest, she saw her PCP again and had spirometry in 5/15 and was told that these were normal. She was given 11 Levaquin prescription which she has not filled. She will be establishing  with a local pulmonologist in July  Past Medical History  Diagnosis Date  . Atrial fibrillation   . Sarcoidosis   . OSA (obstructive sleep apnea)     on CPAP   Past Surgical History  Procedure Laterality Date  . Abdominal hysterectomy    . Breast fibroadenoma surgery      Allergies  Allergen Reactions  . Digoxin Anaphylaxis    REACTION: severe bradycardia  . Penicillins Anaphylaxis    REACTION: Hives  . Lactose Intolerance (Gi)     History   Social History  . Marital Status: Divorced    Spouse Name: N/A    Number of Children: 3  . Years of Education: N/A   Occupational History  . health educator    Social History Main Topics  . Smoking status: Never Smoker   . Smokeless tobacco: Not on file  . Alcohol Use: Yes     Comment: social  . Drug Use: No  . Sexual Activity: Not on file   Other Topics Concern  . Not on file   Social History Narrative  . No narrative on file    Family History  Problem Relation Age of Onset  . Asthma Brother      Review of Systems  Constitutional: Positive for appetite change. Negative for fever and unexpected weight change.  HENT: Positive for congestion and sneezing. Negative for dental problem, ear pain, nosebleeds, postnasal drip, rhinorrhea, sinus pressure, sore throat and trouble swallowing.   Eyes: Negative for redness and itching.  Respiratory:  Positive for cough and shortness of breath. Negative for chest tightness and wheezing.   Cardiovascular: Positive for chest pain and palpitations. Negative for leg swelling.  Gastrointestinal: Negative for nausea and vomiting.  Genitourinary: Negative for dysuria.  Musculoskeletal: Negative for joint swelling.  Skin: Negative for rash.  Neurological: Negative for headaches.  Hematological: Does not bruise/bleed easily.  Psychiatric/Behavioral: Negative for dysphoric mood. The patient is nervous/anxious.        Objective:   Physical Exam  Gen. Pleasant, obese, in no  distress, normal affect ENT - no lesions, no post nasal drip, class 2-3 airway Neck: No JVD, no thyromegaly, no carotid bruits Lungs: no use of accessory muscles, no dullness to percussion, decreased without rales or rhonchi  Cardiovascular: Rhythm regular, heart sounds  normal, no murmurs or gallops, no peripheral edema Abdomen: soft and non-tender, no hepatosplenomegaly, BS normal. Musculoskeletal: No deformities, no cyanosis or clubbing Neuro:  alert, non focal, no tremors       Assessment & Plan:

## 2014-04-29 NOTE — Patient Instructions (Addendum)
You have bronchiectasis more in right upper lobe, mild on left upper lobe -unchanged from prior scans This is likely related to sarcoidosis -no evidence of active disease. You may have had an attack of acute bronchitis Take antibiotic only if yellow or green phlegm OK to take albuterol as needed only Obtain copy of lung function from Dr Minette Brine Stay on zyrytec during allergy season Blood work today for allergies -RAST, ACe level Get back on CPAP - wt loss recommended

## 2014-04-29 NOTE — Assessment & Plan Note (Signed)
You have bronchiectasis more in right upper lobe, mild on left upper lobe -unchanged from prior scans This is likely related to sarcoidosis -no evidence of active disease. You may have had an attack of acute bronchitis Take antibiotic only if yellow or green phlegm OK to take albuterol as needed only Obtain copy of lung function from Dr Minette Brine Stay on zyrytec during allergy season Blood work today for allergies -RAST, ACe level

## 2014-04-29 NOTE — Assessment & Plan Note (Signed)
Get back on CPAP 

## 2014-04-30 LAB — ALLERGY FULL PROFILE
Bermuda Grass: <0.1 kU/L
Box Elder IgE: <0.1 kU/L
Candida Albicans: <0.1 kU/L
Curvularia lunata: <0.1 kU/L
Elm IgE: <0.1 kU/L
Fescue: <0.1 kU/L
G009 Red Top: <0.1 kU/L
IGE (IMMUNOGLOBULIN E), SERUM: 46 [IU]/mL (ref 0.0–180.0)
Oak: <0.1 kU/L
Timothy Grass: <0.1 kU/L

## 2014-04-30 LAB — ANGIOTENSIN CONVERTING ENZYME: Angiotensin-Converting Enzyme: 34 U/L (ref 8–52)

## 2020-11-18 ENCOUNTER — Other Ambulatory Visit (HOSPITAL_COMMUNITY): Payer: Self-pay | Admitting: Nurse Practitioner

## 2020-11-18 DIAGNOSIS — R1031 Right lower quadrant pain: Secondary | ICD-10-CM

## 2020-11-18 DIAGNOSIS — Z87442 Personal history of urinary calculi: Secondary | ICD-10-CM

## 2020-11-18 DIAGNOSIS — R1011 Right upper quadrant pain: Secondary | ICD-10-CM

## 2020-12-01 ENCOUNTER — Encounter (HOSPITAL_COMMUNITY): Payer: Self-pay

## 2020-12-01 ENCOUNTER — Ambulatory Visit (HOSPITAL_COMMUNITY)
Admission: RE | Admit: 2020-12-01 | Discharge: 2020-12-01 | Disposition: A | Discharge status: Home / Self Care | Payer: BC Managed Care – PPO | Source: Ambulatory Visit | Attending: Nurse Practitioner | Admitting: Nurse Practitioner

## 2020-12-01 ENCOUNTER — Other Ambulatory Visit: Payer: Self-pay

## 2020-12-01 DIAGNOSIS — Z87442 Personal history of urinary calculi: Secondary | ICD-10-CM | POA: Insufficient documentation

## 2020-12-01 DIAGNOSIS — R1011 Right upper quadrant pain: Secondary | ICD-10-CM | POA: Diagnosis not present

## 2020-12-01 DIAGNOSIS — R1031 Right lower quadrant pain: Secondary | ICD-10-CM | POA: Diagnosis present

## 2020-12-01 IMAGING — CT CT ABD-PEL WO/W CM
4 of 9 series · 13 of 46 positions shown, 19 images · IV contrast (omnipaque)
Comparison: [DATE].

CLINICAL DATA: Abdominal pain for 1 month. Difficulty urinating.
History of kidney stones.

EXAM:
CT ABDOMEN AND PELVIS WITHOUT AND WITH CONTRAST
TECHNIQUE: Multidetector CT imaging of the abdomen and pelvis was performed
following the standard protocol before and following the bolus
administration of intravenous contrast.
CONTRAST:  100mL OMNIPAQUE IOHEXOL 300 MG/ML  SOLN

[Series 2: axial st · axial · 0.79mm/px · z∈[-163,+57]mm · 3 of 89 slices shown (1 of 2)]
[im 23/89  soft-tissue]
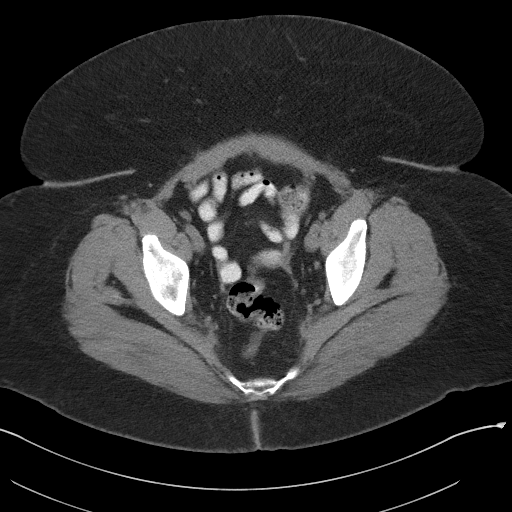
[im 45/89  soft-tissue]
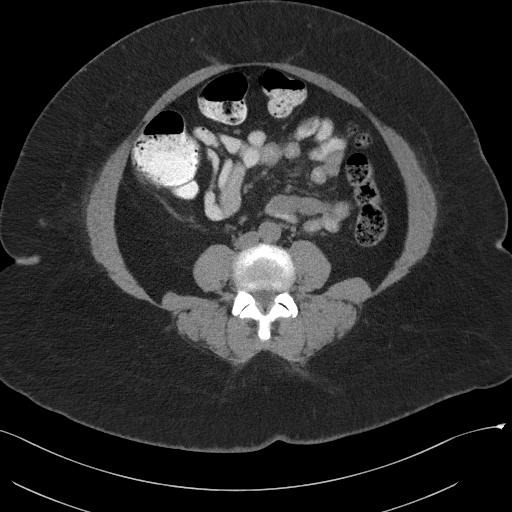
[im 67/89  soft-tissue]
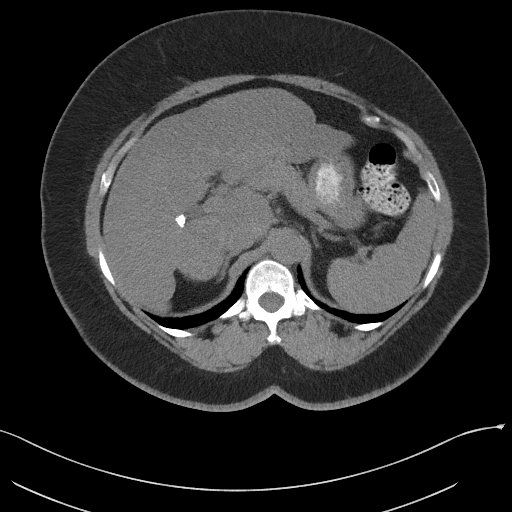

[Series 4: axial st · axial · 0.79mm/px · z∈[-183,+77]mm · 4 of 88 slices shown, 9 images (2 of 2)]
[im 18/88  soft-tissue]
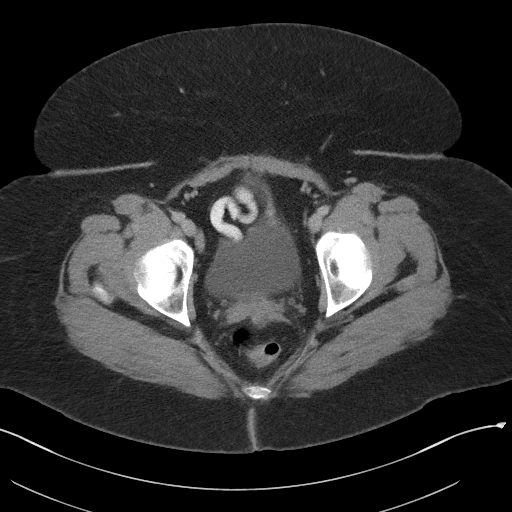
[im 18/88  lung]
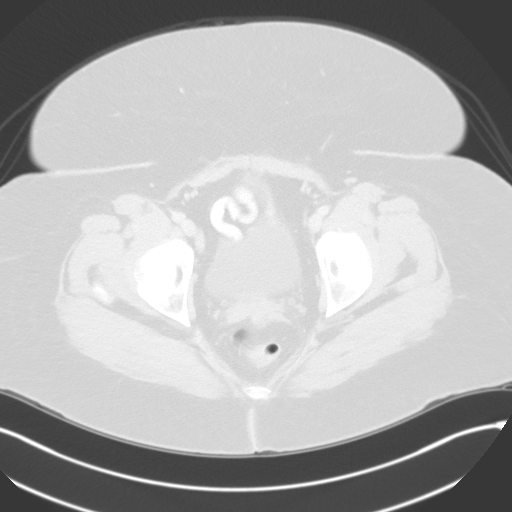
[im 18/88  bone]
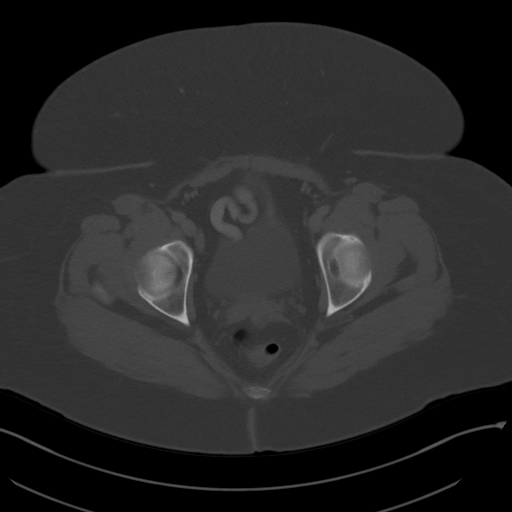
[im 35/88  soft-tissue]
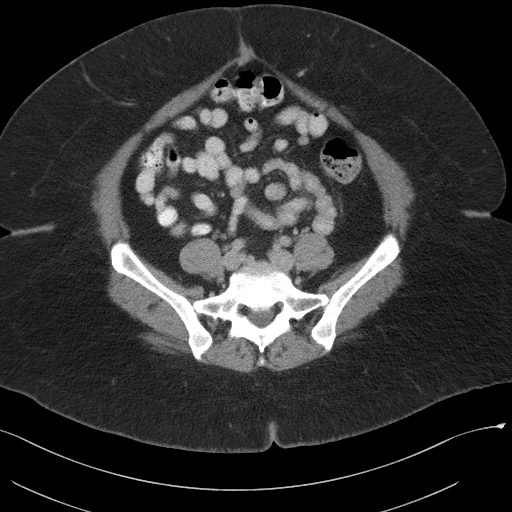
[im 35/88  lung]
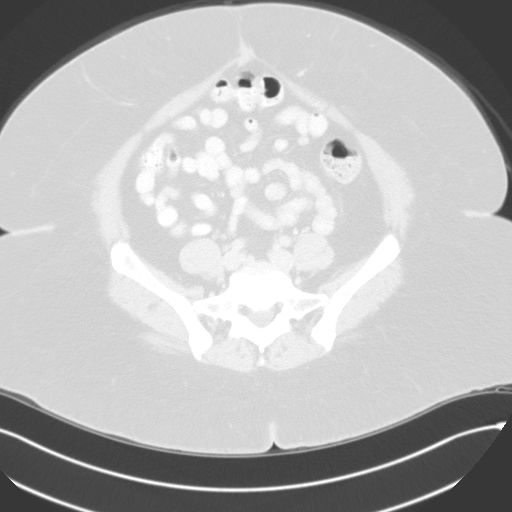
[im 53/88  soft-tissue]
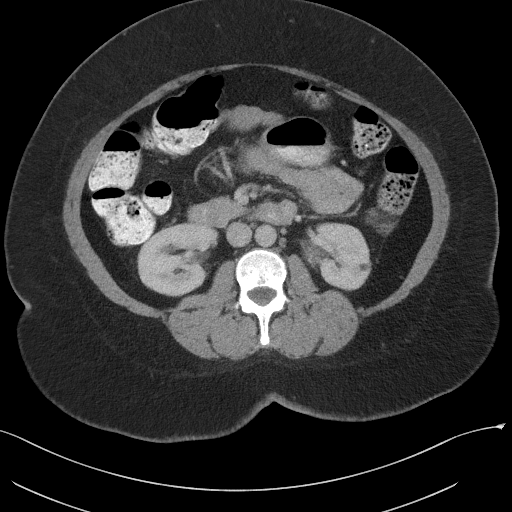
[im 53/88  lung]
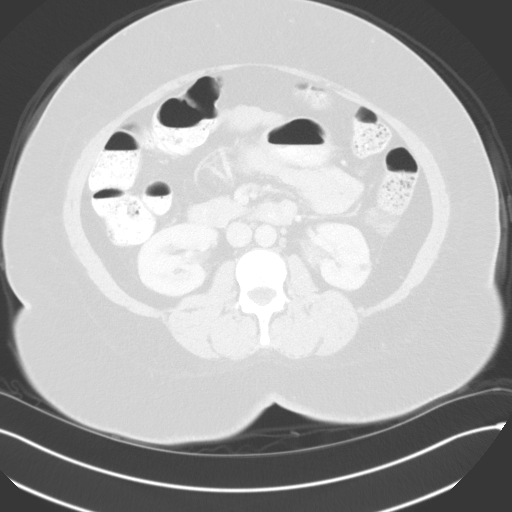
[im 70/88  soft-tissue]
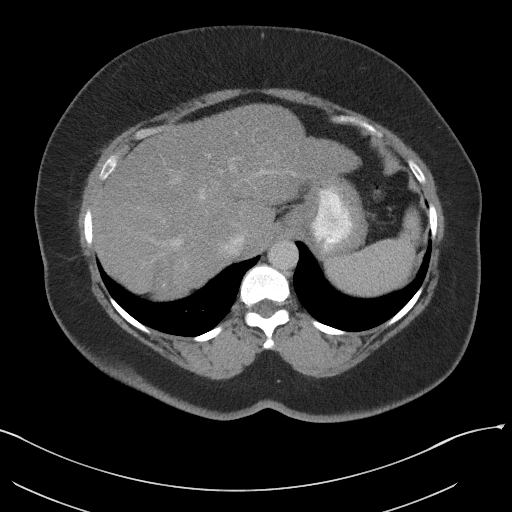
[im 70/88  lung]
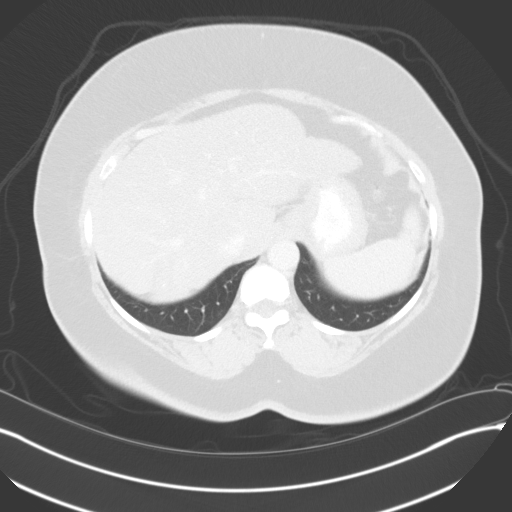

[Series 6: lung post · axial · 0.79mm/px · z∈[-239,-137]mm · 3 of 220 slices shown]
[im 17/220  bone]
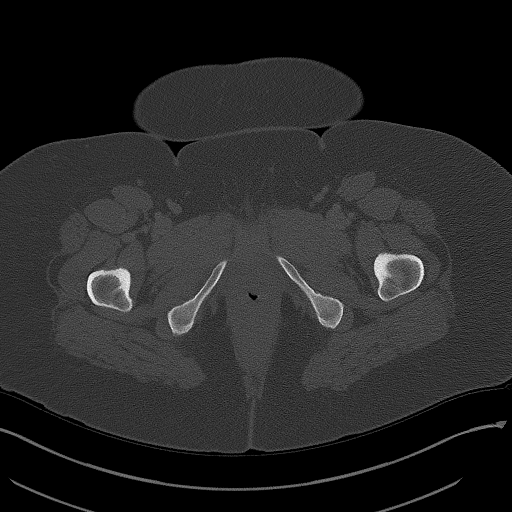
[im 51/220  bone]
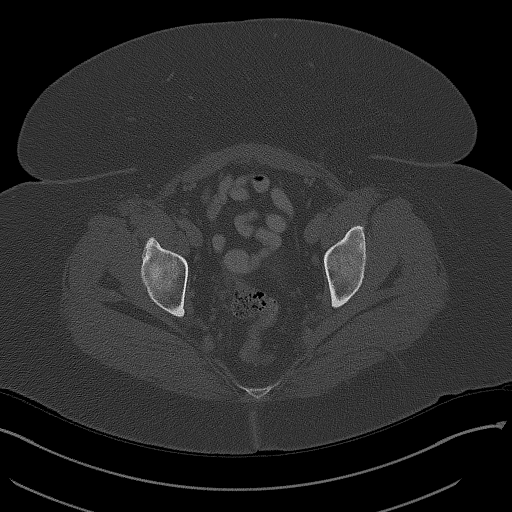
[im 68/220  bone]
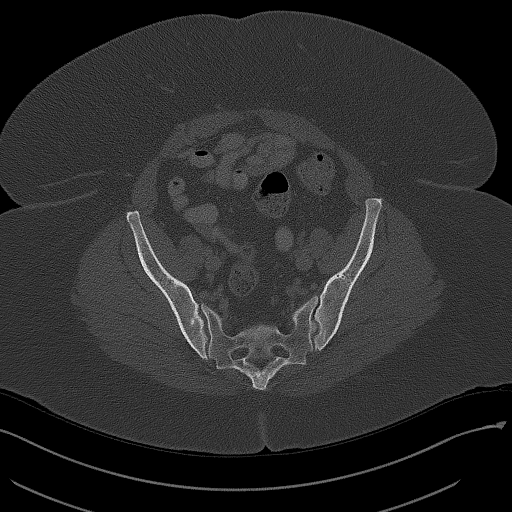

[Series 9: coronal st · coronal · 0.88mm/px · 3 of 120 slices shown, 4 images]
[im 30/120  soft-tissue]
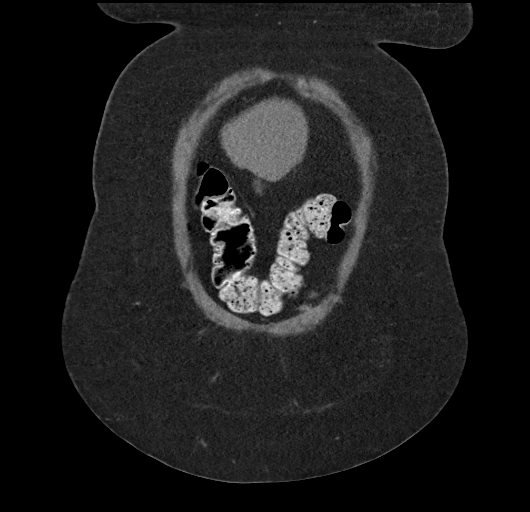
[im 60/120  soft-tissue]
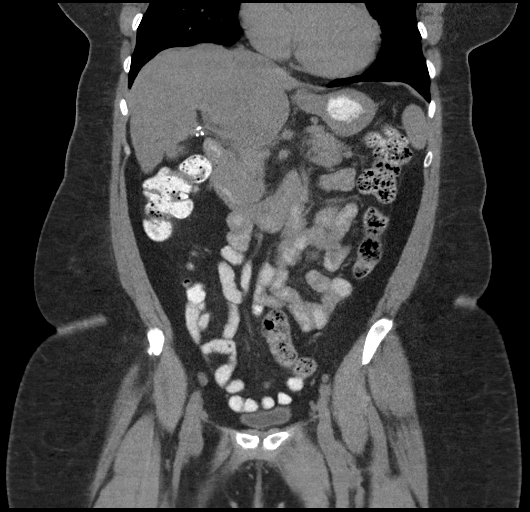
[im 60/120  bone]
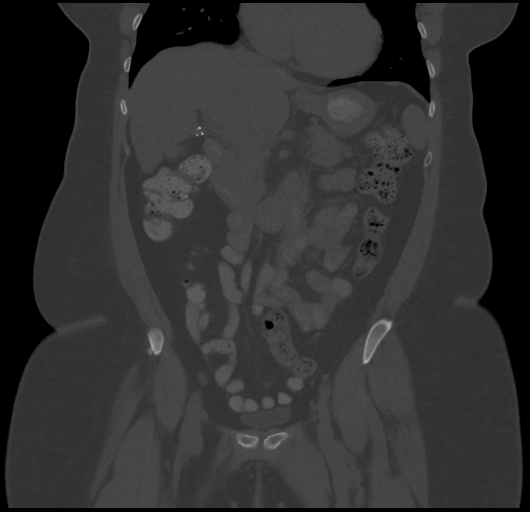
[im 90/120  soft-tissue]
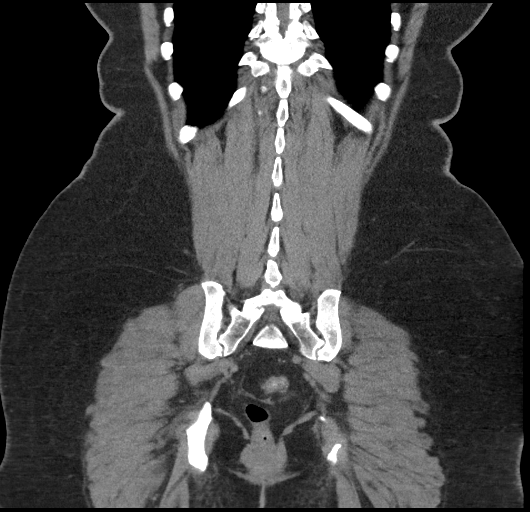

[13 of 46 positions shown; findings below may reference images not displayed]

FINDINGS: Lower chest: Scarring in the right middle lobe. Heart is at the
upper limits of normal in size. No pericardial or pleural effusion.

Hepatobiliary: Liver is decreased in attenuation diffusely and is
mildly heterogeneous. Margin is irregular. Calcifications and
low-attenuation are seen in the posterior right hepatic lobe,
measuring approximately 2.3 cm ([DATE]), new from [DATE].
Cholecystectomy. No biliary ductal dilatation.

Pancreas: Negative.

Spleen: Negative.

Adrenals/Urinary Tract: Adrenal glands and right kidney are
unremarkable. 9 mm fat density lesion in the lower pole left kidney,
unchanged and indicative of an angiomyolipoma. Ureters are
decompressed. Bladder is grossly unremarkable.

Stomach/Bowel: Stomach, small bowel, appendix and colon are
unremarkable.

Vascular/Lymphatic: Vascular structures are unremarkable. No
pathologically enlarged lymph nodes.

Reproductive: Hysterectomy.  No adnexal mass.

Other: No free fluid.  Mesenteries and peritoneum are unremarkable.

Musculoskeletal: None.
IMPRESSION: 1. No findings to explain the patient's clinical history.
2. Cirrhosis.
3. Focal area of low attenuation and calcification in the posterior
right hepatic lobe is incompletely evaluated in the presence of
steatosis and appears new from [DATE]. If further evaluation is
desired, MR abdomen without and with contrast is recommended.

## 2020-12-01 MED ORDER — IOHEXOL 300 MG/ML  SOLN
100.0000 mL | Freq: Once | INTRAMUSCULAR | Status: AC | PRN
  Administered 2020-12-01: 100 mL via INTRAVENOUS

## 2021-01-06 ENCOUNTER — Other Ambulatory Visit: Payer: Self-pay | Admitting: Nurse Practitioner

## 2021-01-06 DIAGNOSIS — K7469 Other cirrhosis of liver: Secondary | ICD-10-CM

## 2021-01-06 DIAGNOSIS — D376 Neoplasm of uncertain behavior of liver, gallbladder and bile ducts: Secondary | ICD-10-CM

## 2021-01-31 ENCOUNTER — Other Ambulatory Visit: Payer: Self-pay

## 2021-01-31 ENCOUNTER — Ambulatory Visit
Admission: RE | Admit: 2021-01-31 | Discharge: 2021-01-31 | Disposition: A | Discharge status: Home / Self Care | Payer: BC Managed Care – PPO | Source: Ambulatory Visit | Attending: Nurse Practitioner | Admitting: Nurse Practitioner

## 2021-01-31 DIAGNOSIS — D376 Neoplasm of uncertain behavior of liver, gallbladder and bile ducts: Secondary | ICD-10-CM

## 2021-01-31 DIAGNOSIS — K7469 Other cirrhosis of liver: Secondary | ICD-10-CM

## 2021-01-31 IMAGING — MR MR ABDOMEN WO/W CM MRCP
13 of 21 series · 24 of 48 positions shown · IV contrast (multihance)
Comparison: CT scan [DATE]

CLINICAL DATA: Posterior right hepatic lobe lesion seen on recent
CT scan.

EXAM:
MRI ABDOMEN WITHOUT AND WITH CONTRAST (INCLUDING MRCP)
TECHNIQUE: Multiplanar multisequence MR imaging of the abdomen was performed
both before and after the administration of intravenous contrast.
Heavily T2-weighted images of the biliary and pancreatic ducts were
obtained, and three-dimensional MRCP images were rendered by post
processing.
CONTRAST:  20mL MULTIHANCE GADOBENATE DIMEGLUMINE 529 MG/ML IV SOLN

[Series 4: cor haste · coronal · 5.0mm · 0.78mm/px · 2 of 35 slices shown]
[im 1/35]
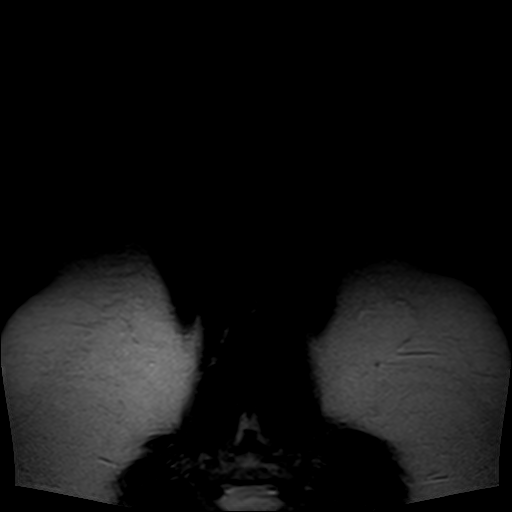
[im 35/35]
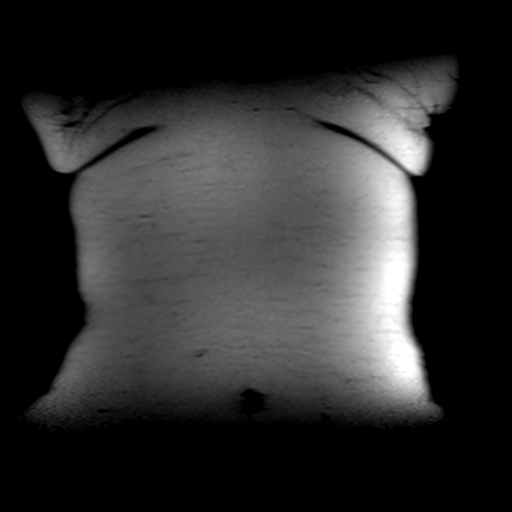

[Series 5: bSSFP · coronal · 5.0mm · 0.78mm/px · 1 of 36 slices shown (1 of 2)]
[im 1/36]
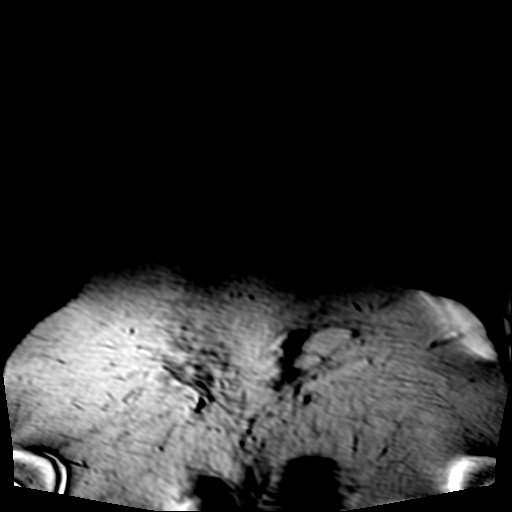

[Series 6: T2 · coronal · 3.0mm · 0.78mm/px · 1 of 15 slices shown (1 of 3)]
[im 1/15]
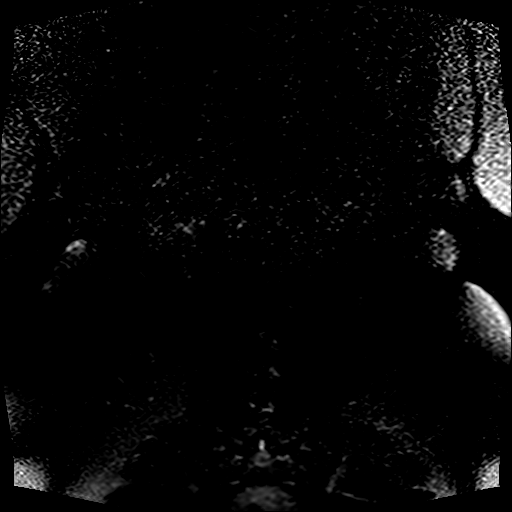

[Series 7: T2 · axial · 6.0mm · 1.12mm/px · 1 of 35 slices shown (2 of 3)]
[im 1/35]
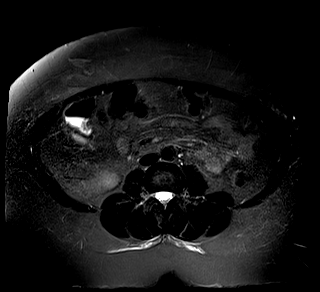

[Series 8: T2 · coronal · 3.0mm · 0.78mm/px · 2 of 59 slices shown (3 of 3)]
[im 1/59]
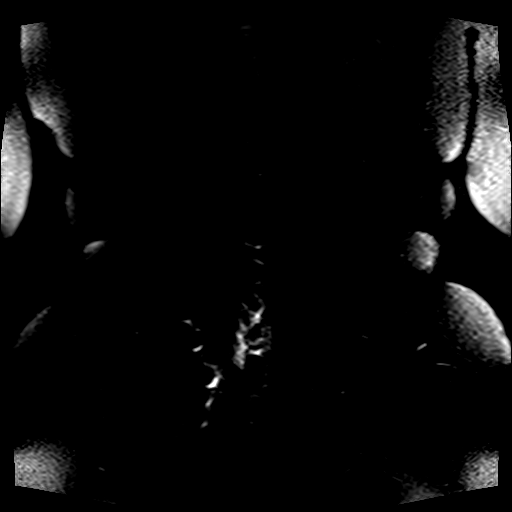
[im 59/59]
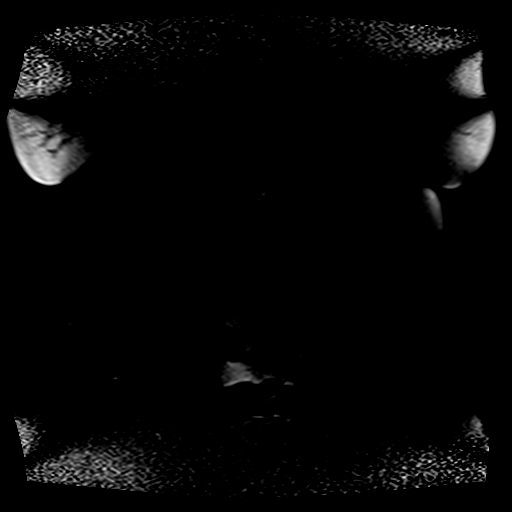

[Series 11: T1 · axial · 6.0mm · 0.78mm/px · z∈[-47,+177]mm · 2 of 70 slices shown]
[im 1/70]
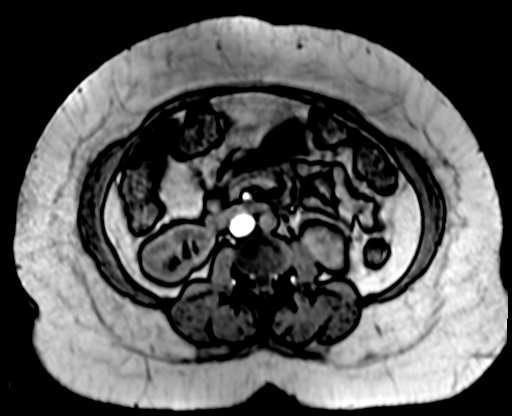
[im 70/70]
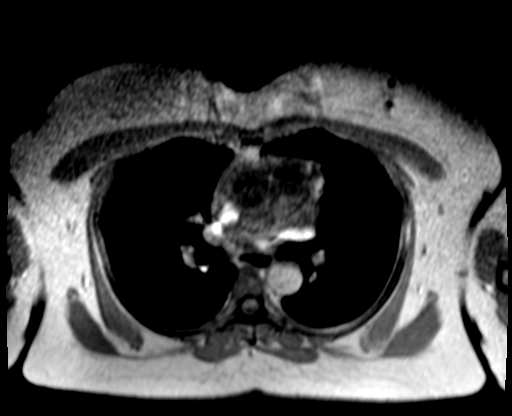

[Series 12: axial haste · axial · 6.0mm · 0.78mm/px · 1 of 35 slices shown]
[im 1/35]
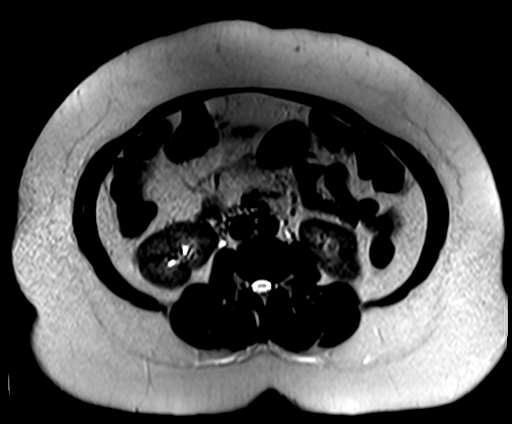

[Series 13: ep2d_diff_b50_500_800_p2_trig · coronal · 6.0mm · 2.08mm/px · 3 of 99 slices shown]
[im 1/99]
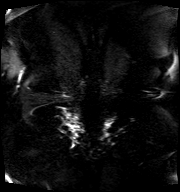
[im 50/99]
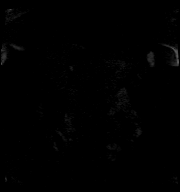
[im 99/99]
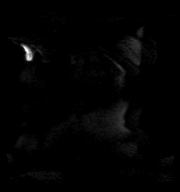

[Series 14: ep2d_[id number]_500_800_p2_trig_adc · coronal · 6.0mm · 2.08mm/px · 1 of 33 slices shown]
[im 1/33]
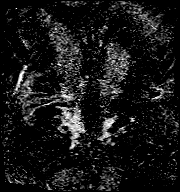

[Series 17: bSSFP · axial · 4.0mm · 0.78mm/px · z∈[-47,+177]mm · 2 of 57 slices shown (2 of 2)]
[im 1/57]
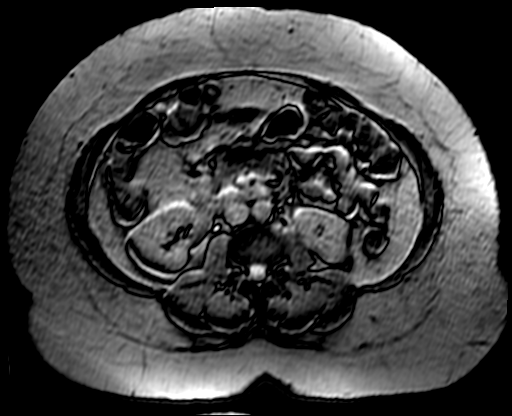
[im 57/57]
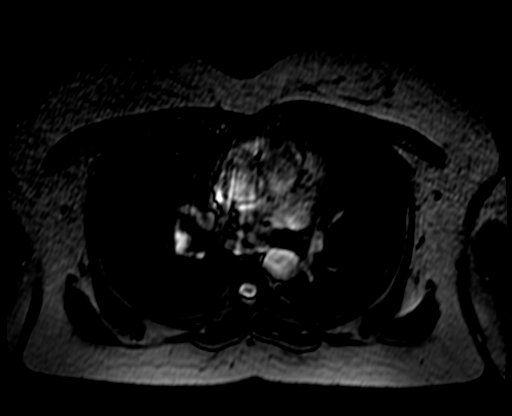

[Series 18: T1 dynamic · axial · non-contrast · 2.5mm · 0.78mm/px · z∈[-44,+174]mm · 3 of 88 slices shown]
[im 1/88]
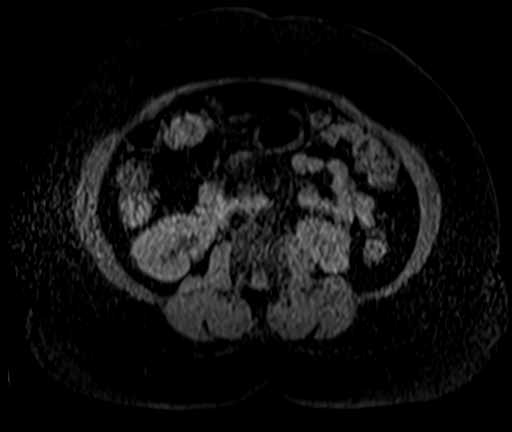
[im 44/88]
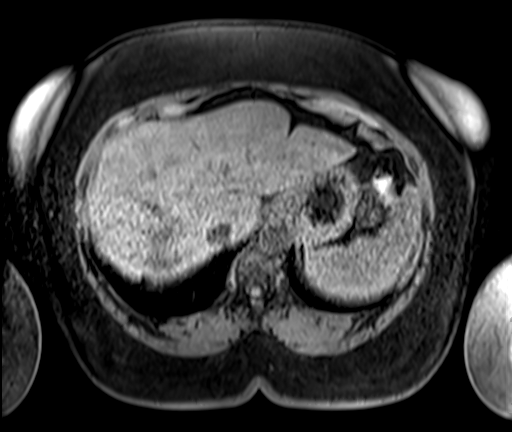
[im 88/88]
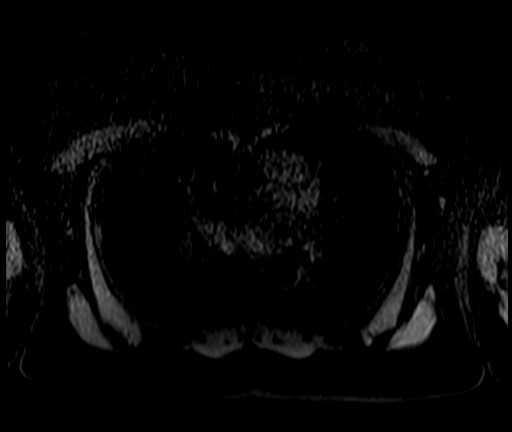

[Series 19: T1 dynamic post-contrast · axial · 2.5mm · 0.78mm/px · z∈[-44,+174]mm · 3 of 88 slices shown (1 of 2)]
[im 1/88]
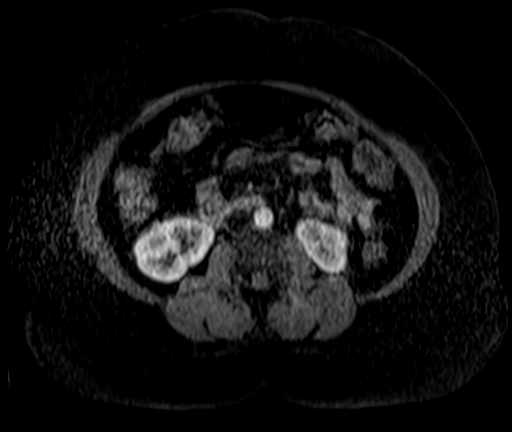
[im 44/88]
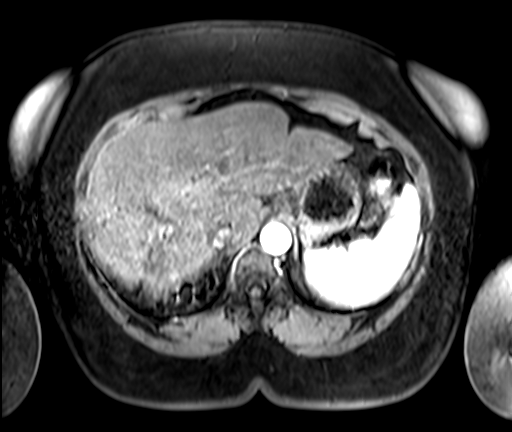
[im 88/88]
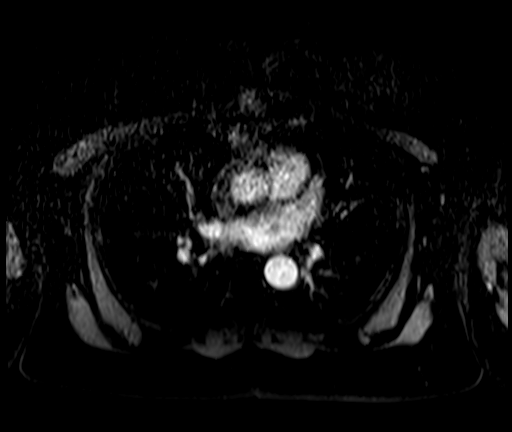

[Series 20: T1 dynamic post-contrast · axial · 2.5mm · 0.78mm/px · z∈[-44,+64]mm · 2 of 88 slices shown (2 of 2)]
[im 1/88]
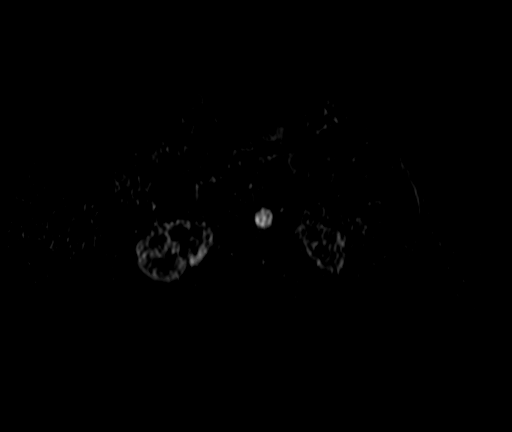
[im 44/88]
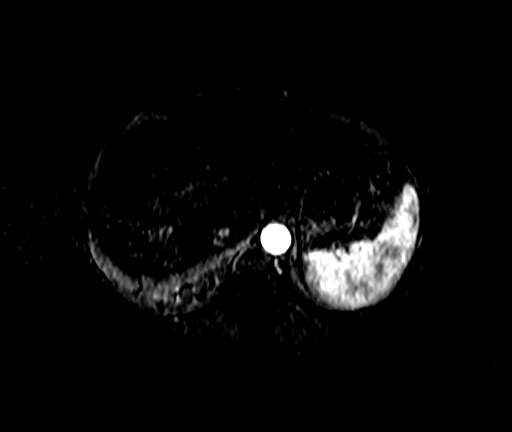

[24 of 48 positions shown; findings below may reference images not displayed]

FINDINGS: Lower chest: Unremarkable.

Hepatobiliary: Nodular hepatic contour is compatible with cirrhosis.
Diffuse but heterogeneous loss of signal intensity in liver
parenchyma on out of phase T1 imaging is consistent with fatty
deposition. Area of concern in the posterior dome of the right liver
shows low signal intensity on T1 pre contrast imaging with near
isointense signal intensity on T2 imaging. After IV contrast
administration, this wedge-shaped area of subcapsular altered signal
shows markedly decreased perfusion, likely representing an area of
infarct/scarring.

No focal areas of arterial phase hyperenhancement within the liver
on today's study. Gallbladder surgically absent. No intrahepatic or
extrahepatic biliary dilation.

Pancreas: 4 mm cystic focus is identified in the body of pancreas,
immediately adjacent to the main pancreatic duct. No main pancreatic
ductal dilatation.

Spleen:  No splenomegaly. No focal mass lesion.

Adrenals/Urinary Tract: No adrenal nodule or mass. Right kidney
unremarkable. Tiny 9 mm cystic focus in the lower pole left kidney
is seen only on coronal imaging, but stable since prior CT.

Stomach/Bowel: Stomach is unremarkable. No gastric wall thickening.
No evidence of outlet obstruction. No small bowel or colonic
dilatation within the visualized abdomen.

Vascular/Lymphatic: No abdominal aortic aneurysm. Portal vein,
superior mesenteric vein, and splenic vein are patent. No abdominal
lymphadenopathy.

Other:  No intraperitoneal free fluid.

Musculoskeletal: No focal suspicious marrow enhancement within the
visualized bony anatomy.
IMPRESSION: 1. Area of concern in the posterior dome of the right liver on
recent CT scan is most likely benign and probably represents an area
of subcapsular infarct/scarring. No arterial phase hyperenhancement.
No restricted diffusion. Although subtle, this lesion was visible on
a CTA chest of [DATE].
[DATE]. Hepatic steatosis with sequelae of cirrhosis.
3. 4 mm cystic focus in the body of pancreas, immediately adjacent
to the main pancreatic duct. This is probably a tiny side branch
IPMN. Follow-up MRI in 1 year recommended to ensure stability.

## 2021-01-31 MED ORDER — GADOBENATE DIMEGLUMINE 529 MG/ML IV SOLN
20.0000 mL | Freq: Once | INTRAVENOUS | Status: AC | PRN
  Administered 2021-01-31: 20 mL via INTRAVENOUS

## 2021-07-06 ENCOUNTER — Other Ambulatory Visit: Payer: Self-pay | Admitting: Nurse Practitioner

## 2021-07-06 DIAGNOSIS — K7469 Other cirrhosis of liver: Secondary | ICD-10-CM

## 2021-07-07 ENCOUNTER — Ambulatory Visit
Admission: RE | Admit: 2021-07-07 | Discharge: 2021-07-07 | Disposition: A | Discharge status: Home / Self Care | Payer: BC Managed Care – PPO | Source: Ambulatory Visit | Attending: Nurse Practitioner | Admitting: Nurse Practitioner

## 2021-07-07 DIAGNOSIS — K7469 Other cirrhosis of liver: Secondary | ICD-10-CM

## 2021-07-07 IMAGING — US US ABDOMEN LIMITED
1 series · 14 of 25 positions shown · non-contrast
Comparison: [DATE]

CLINICAL DATA: Other cirrhosis of liver (HCC). CIRRHOSIS, SCREEN
FOR HCC

EXAM:
ULTRASOUND ABDOMEN LIMITED RIGHT UPPER QUADRANT

[Series 1: us abdomen limited · 0.23mm/px · 14 of 41 slices shown]
[im 1/41]
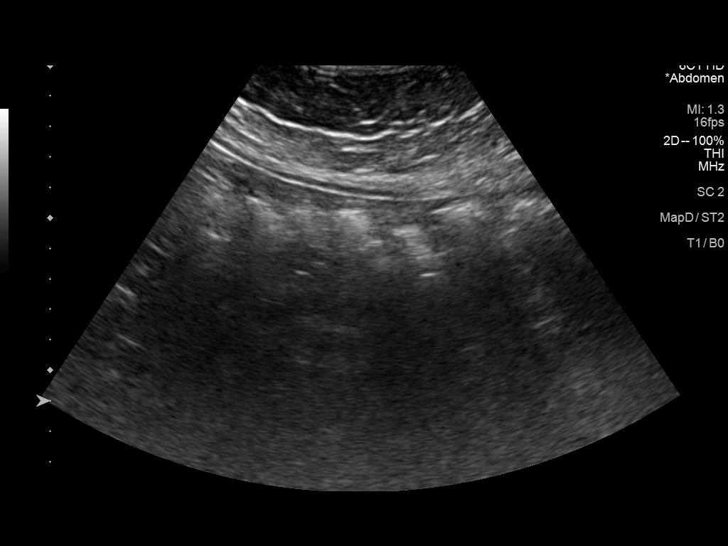
[im 4/41]
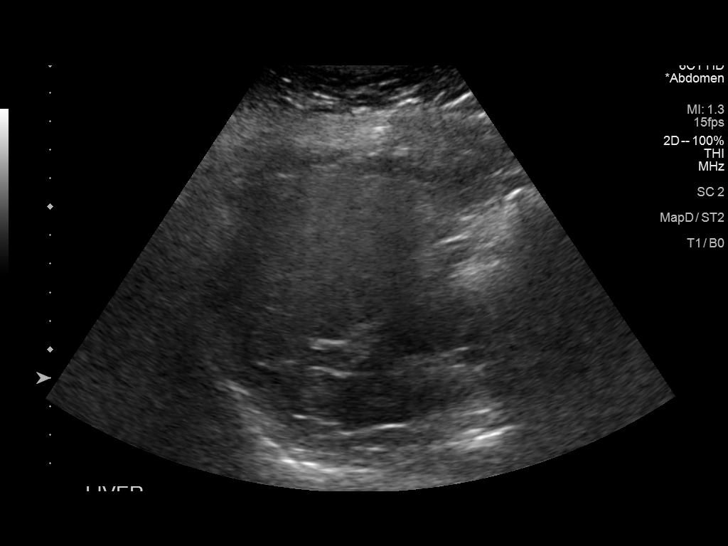
[im 7/41]
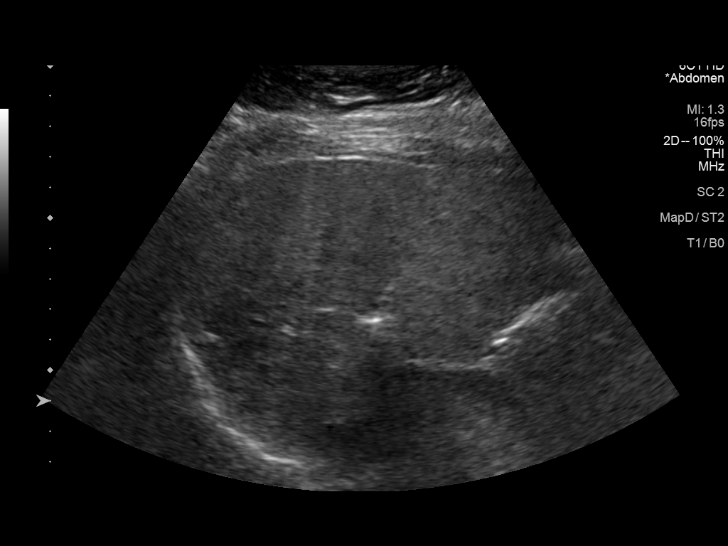
[im 11/41]
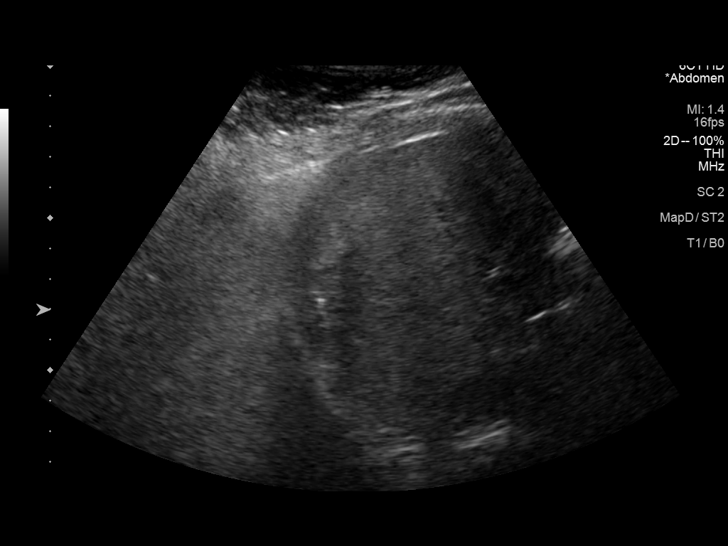
[im 14/41]
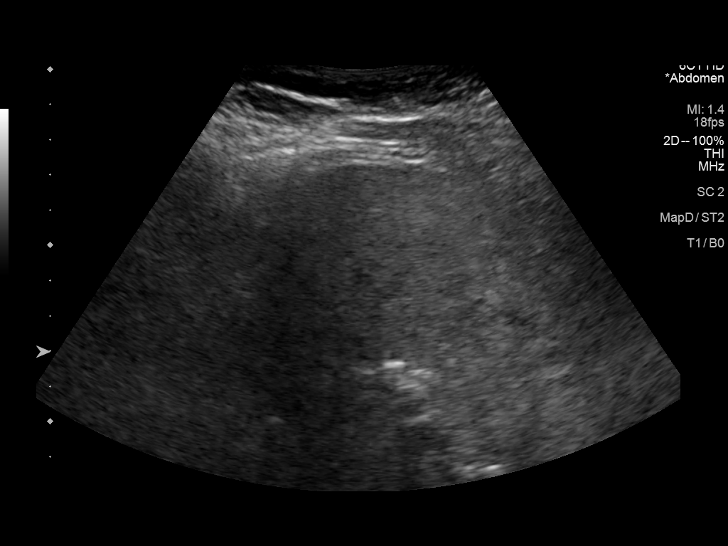
[im 16/41]
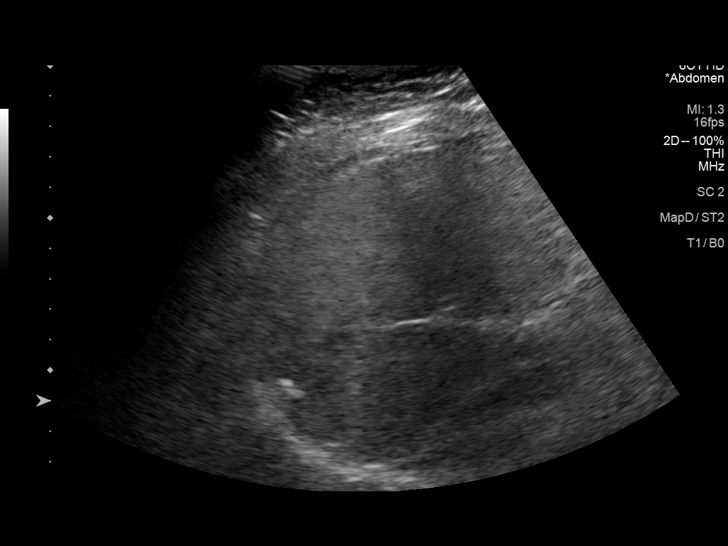
[im 19/41]
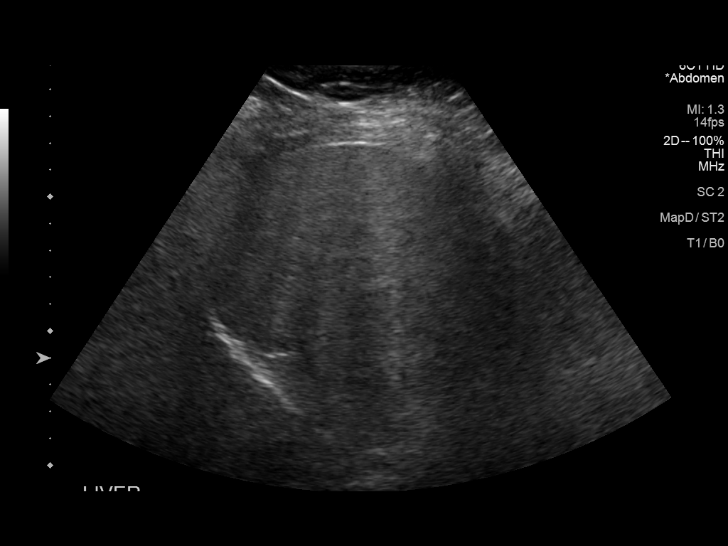
[im 22/41]
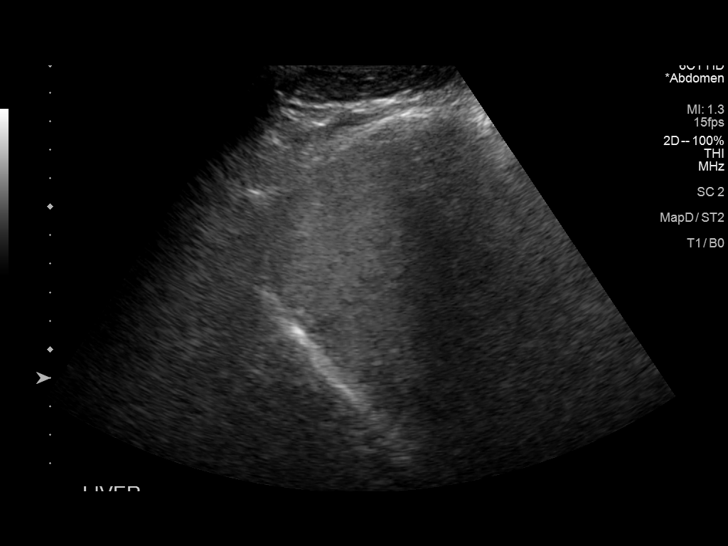
[im 26/41]
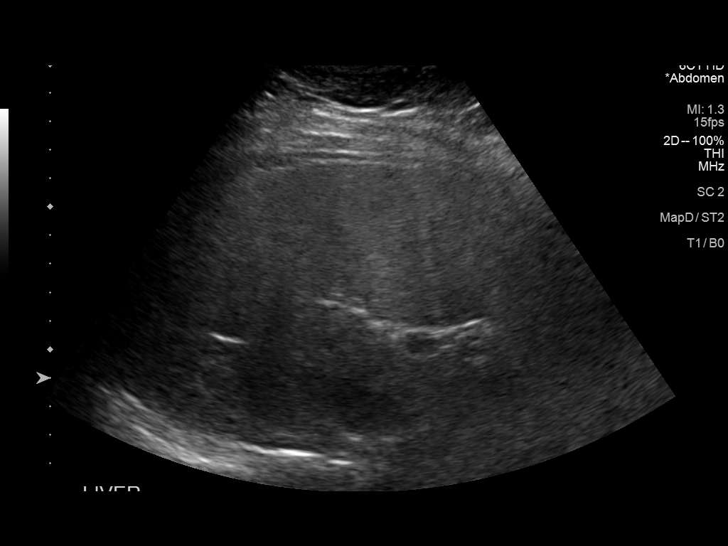
[im 27/41]
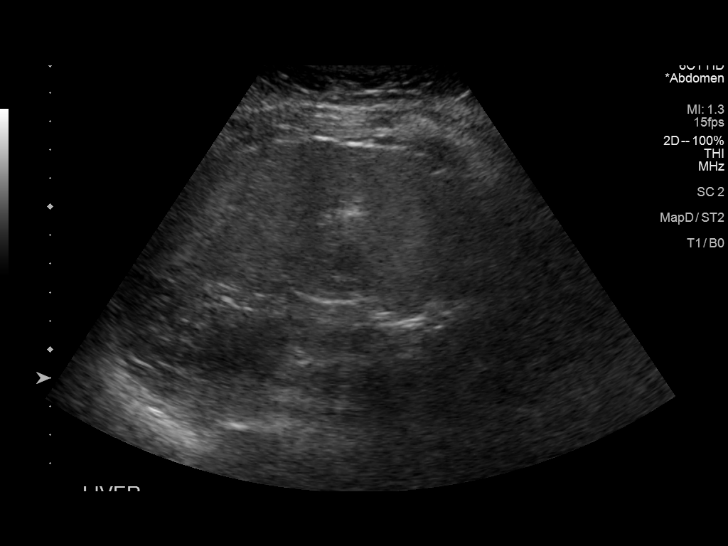
[im 31/41]
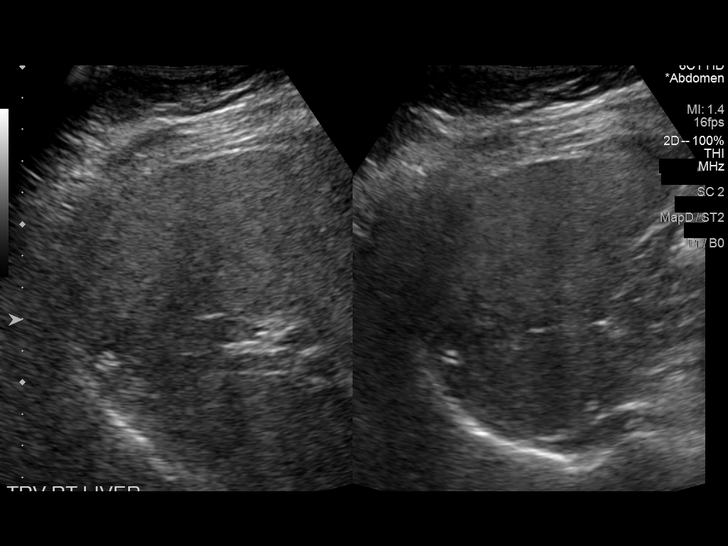
[im 34/41]
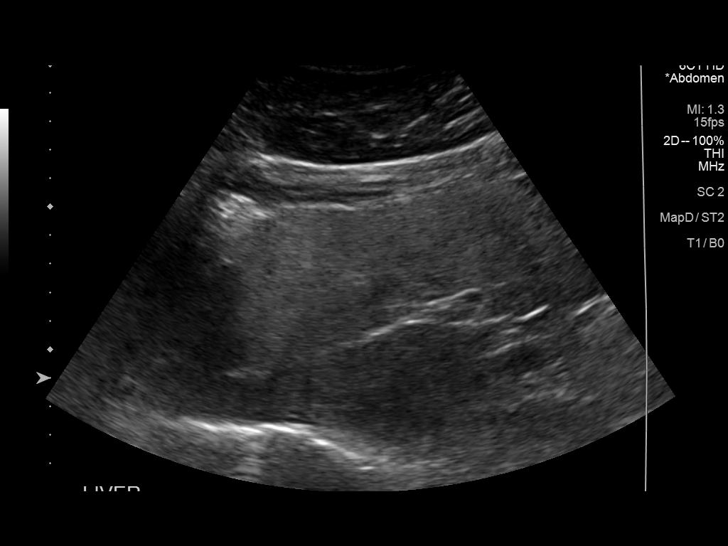
[im 37/41]
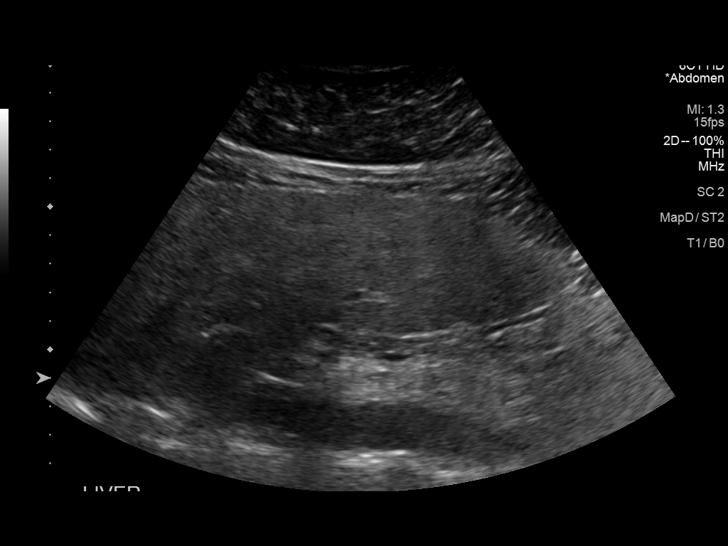
[im 41/41]
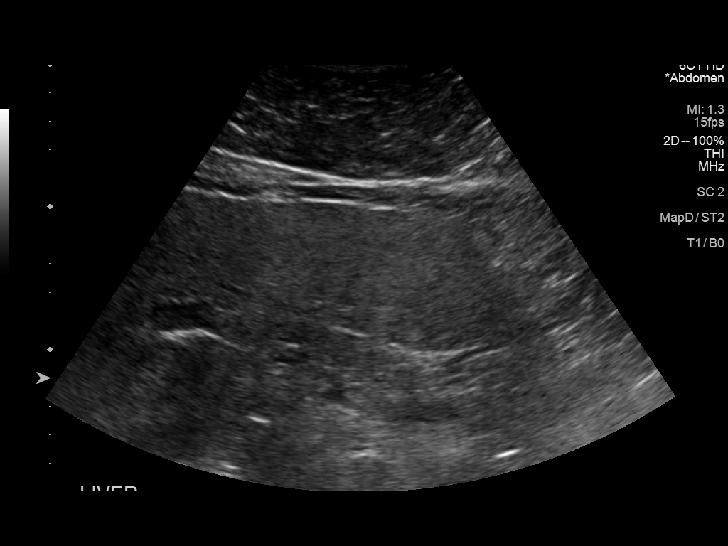

[14 of 25 positions shown; findings below may reference images not displayed]

FINDINGS: Gallbladder:

Status post cholecystectomy.

Common bile duct:

Diameter: 3 mm, normal

Liver:

Nodular contour of the liver. Heterogeneous liver echotexture. There
is revisualization of a ill-defined hypoechoic area with internal
echogenic foci in the hepatic dome posteriorly. It spans
approximately 2.3 x 3.1 x 2.0 cm. This was previously characterized
as an area of likely scarring on prior MRI. Portal vein is patent on
color Doppler imaging with normal direction of blood flow towards
the liver.

Other: None.
IMPRESSION: Similar appearance of a likely area of scarring in the RIGHT hepatic
dome. Cirrhotic appearance of liver. No new focal lesion identified.

## 2021-08-27 ENCOUNTER — Emergency Department (HOSPITAL_COMMUNITY)
Admission: EM | Admit: 2021-08-27 | Discharge: 2021-08-27 | Disposition: A | Discharge status: Home / Self Care | Payer: BC Managed Care – PPO | Attending: Emergency Medicine | Admitting: Emergency Medicine

## 2021-08-27 ENCOUNTER — Emergency Department (HOSPITAL_COMMUNITY): Payer: BC Managed Care – PPO

## 2021-08-27 ENCOUNTER — Encounter (HOSPITAL_COMMUNITY): Payer: Self-pay

## 2021-08-27 DIAGNOSIS — Y92410 Unspecified street and highway as the place of occurrence of the external cause: Secondary | ICD-10-CM | POA: Diagnosis not present

## 2021-08-27 DIAGNOSIS — S299XXA Unspecified injury of thorax, initial encounter: Secondary | ICD-10-CM | POA: Diagnosis present

## 2021-08-27 DIAGNOSIS — M546 Pain in thoracic spine: Secondary | ICD-10-CM | POA: Diagnosis not present

## 2021-08-27 DIAGNOSIS — R519 Headache, unspecified: Secondary | ICD-10-CM | POA: Diagnosis not present

## 2021-08-27 DIAGNOSIS — S40212A Abrasion of left shoulder, initial encounter: Secondary | ICD-10-CM | POA: Diagnosis not present

## 2021-08-27 DIAGNOSIS — S20311A Abrasion of right front wall of thorax, initial encounter: Secondary | ICD-10-CM | POA: Insufficient documentation

## 2021-08-27 DIAGNOSIS — R0789 Other chest pain: Secondary | ICD-10-CM
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
## 2021-08-27 MED ORDER — ACETAMINOPHEN 500 MG PO TABS
1000.0000 mg | ORAL_TABLET | Freq: Once | ORAL | Status: AC
  Administered 2021-08-27: 1000 mg via ORAL
  Filled 2021-08-27: qty 2

## 2021-08-27 MED ORDER — METHOCARBAMOL 500 MG PO TABS: 500.0000 mg | ORAL_TABLET | Freq: Three times a day (TID) | ORAL | 0 refills | Status: AC | PRN
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
## 2022-01-20 ENCOUNTER — Other Ambulatory Visit: Payer: Self-pay | Admitting: Nurse Practitioner

## 2022-01-20 DIAGNOSIS — D490 Neoplasm of unspecified behavior of digestive system: Secondary | ICD-10-CM

## 2022-02-12 ENCOUNTER — Other Ambulatory Visit: Payer: BC Managed Care – PPO

## 2022-03-06 ENCOUNTER — Ambulatory Visit
Admission: RE | Admit: 2022-03-06 | Discharge: 2022-03-06 | Disposition: A | Discharge status: Home / Self Care | Payer: BC Managed Care – PPO | Source: Ambulatory Visit | Attending: Nurse Practitioner | Admitting: Nurse Practitioner

## 2022-03-06 ENCOUNTER — Other Ambulatory Visit: Payer: Self-pay

## 2022-03-06 DIAGNOSIS — D490 Neoplasm of unspecified behavior of digestive system: Secondary | ICD-10-CM

## 2022-03-06 IMAGING — MR MR ABDOMEN WO/W CM
14 of 19 series · 32 of 48 positions shown · IV contrast (multihance)
Comparison: Abdominal MRI [DATE].

CLINICAL DATA: 61-year-old female with possible intraductal
papillary mucinous neoplasm. Follow-up study.

EXAM:
MRI ABDOMEN WITHOUT AND WITH CONTRAST
TECHNIQUE: Multiplanar multisequence MR imaging of the abdomen was performed
both before and after the administration of intravenous contrast.
CONTRAST:  20mL MULTIHANCE GADOBENATE DIMEGLUMINE 529 MG/ML IV SOLN

[Series 3: T2 · coronal · 5.0mm · 1.56mm/px · 2 of 32 slices shown (1 of 4)]
[im 1/32]
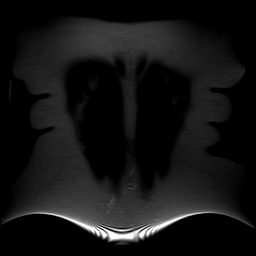
[im 32/32]
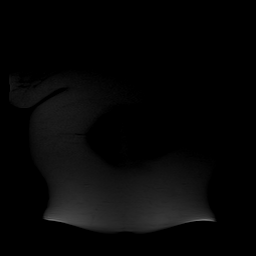

[Series 4: T2 · axial · 6.0mm · 1.56mm/px · z∈[-91,+150]mm · 2 of 36 slices shown (2 of 4)]
[im 1/36]
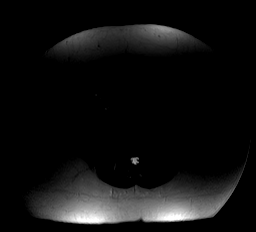
[im 36/36]
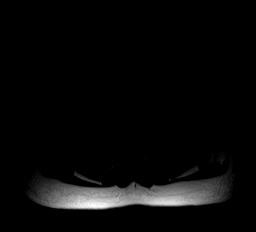

[Series 9: T2 · coronal · 3.0mm · 1.48mm/px · 3 of 53 slices shown (3 of 4)]
[im 1/53]
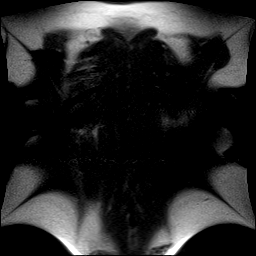
[im 27/53]
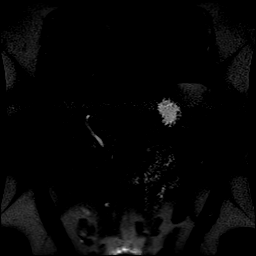
[im 53/53]
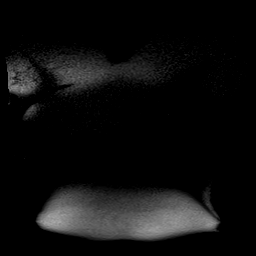

[Series 12: T2 · axial · 6.0mm · 0.78mm/px · 1 of 40 slices shown (4 of 4)]
[im 1/40]
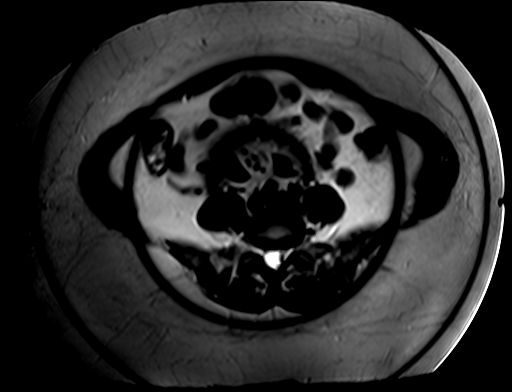

[Series 14: [id number] · axial · 6.0mm · 2.08mm/px · z∈[-88,+181]mm · 4 of 119 slices shown]
[im 1/119]
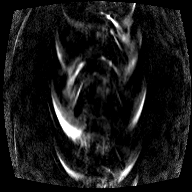
[im 40/119]
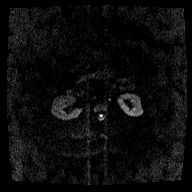
[im 79/119]
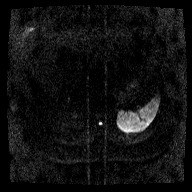
[im 119/119]
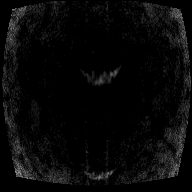

[Series 15: [id number] · axial · 6.0mm · 2.08mm/px · 1 of 40 slices shown]
[im 1/40]
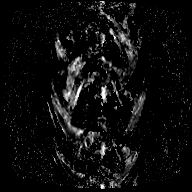

[Series 16: axial tru fisp · axial · 5.0mm · 1.56mm/px · 1 of 38 slices shown]
[im 1/38]
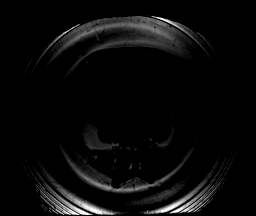

[Series 17: axial in out · axial · 6.0mm · 0.78mm/px · z∈[-77,+136]mm · 2 of 64 slices shown]
[im 1/64]
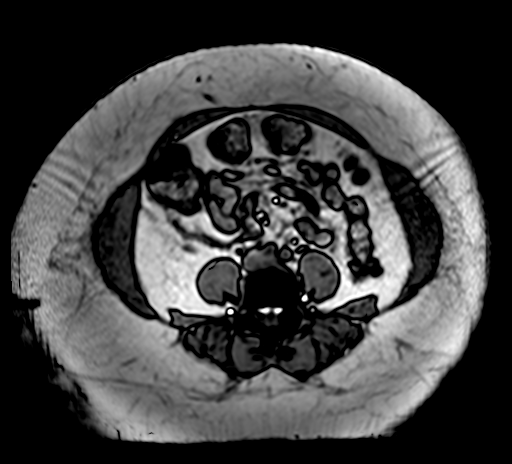
[im 64/64]
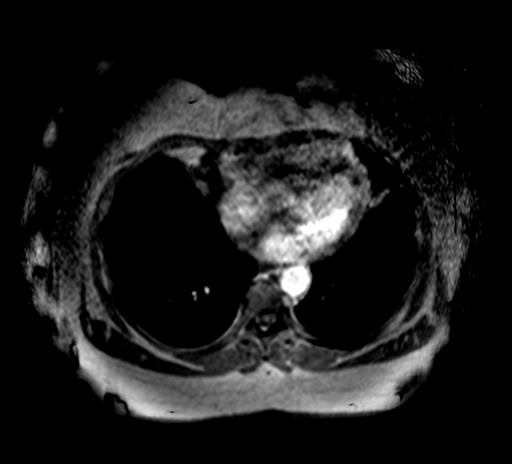

[Series 18: T1 dynamic · axial · non-contrast · 2.5mm · 0.70mm/px · z∈[-82,+136]mm · 3 of 88 slices shown]
[im 1/88]
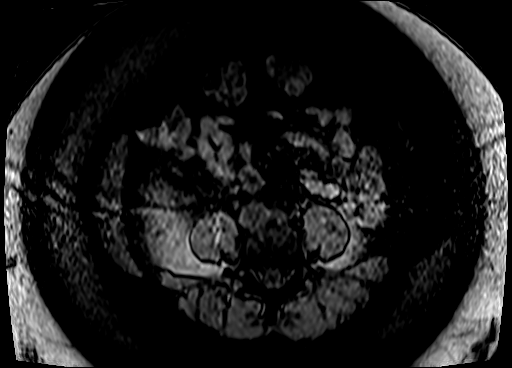
[im 44/88]
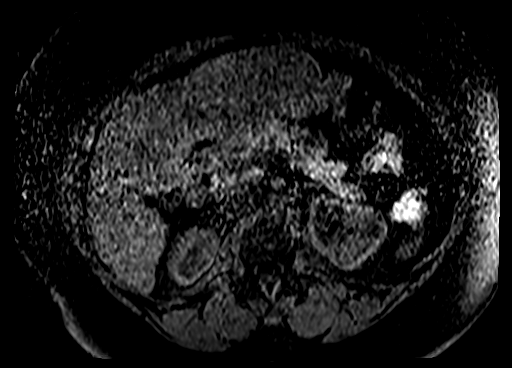
[im 88/88]
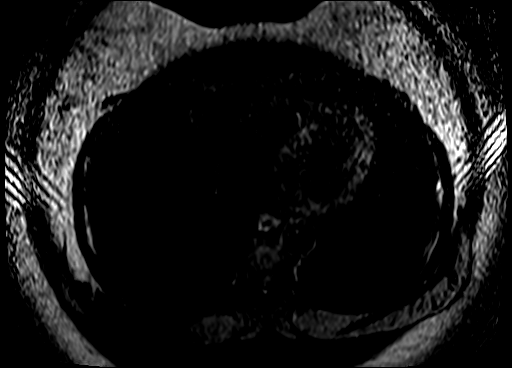

[Series 19: post 25 sec · axial · 2.5mm · 0.70mm/px · z∈[-82,+136]mm · 3 of 88 slices shown]
[im 1/88]
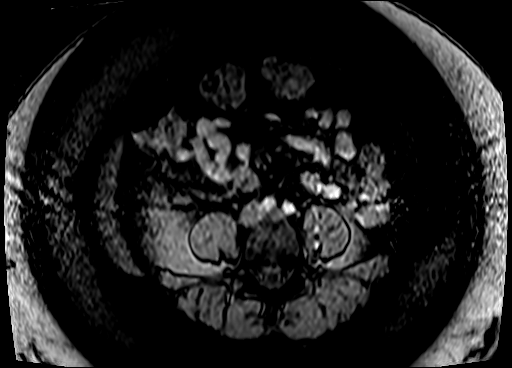
[im 44/88]
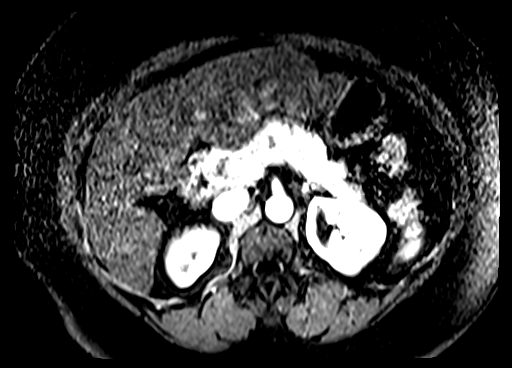
[im 88/88]
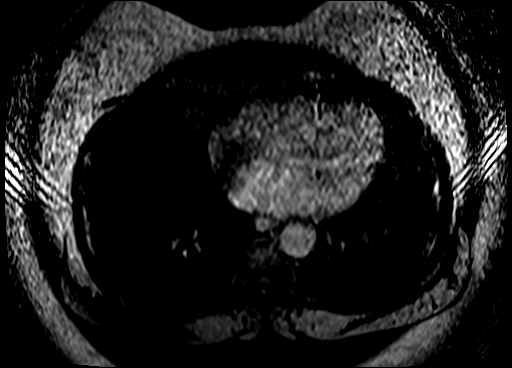

[Series 20: post 25 sec_sub · axial · 2.5mm · 0.70mm/px · z∈[-82,+136]mm · 3 of 88 slices shown]
[im 1/88]
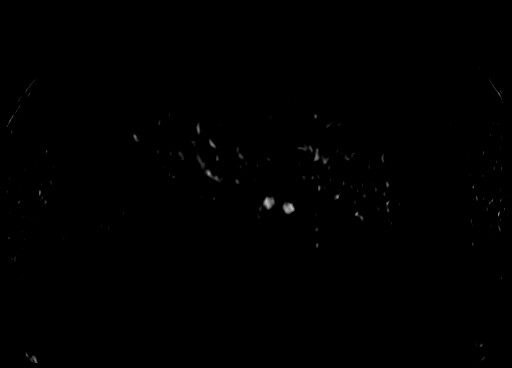
[im 44/88]
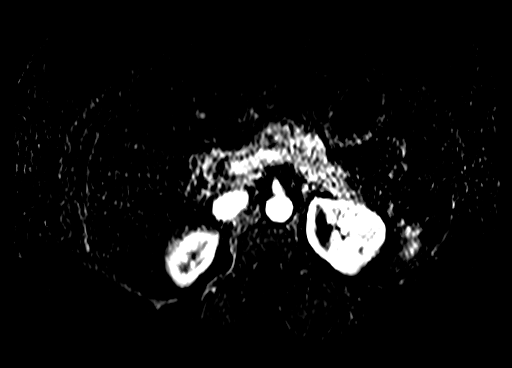
[im 88/88]
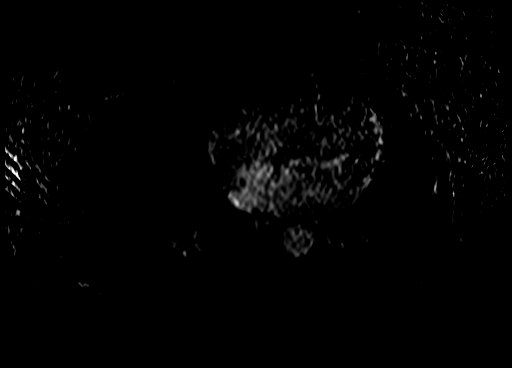

[Series 21: post 45 sec · axial · 2.5mm · 0.70mm/px · z∈[-82,+136]mm · 3 of 85 slices shown]
[im 1/85]
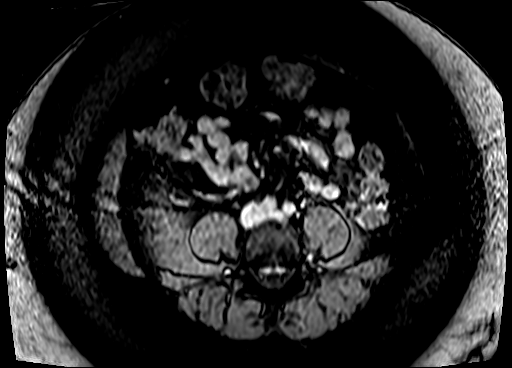
[im 43/85]
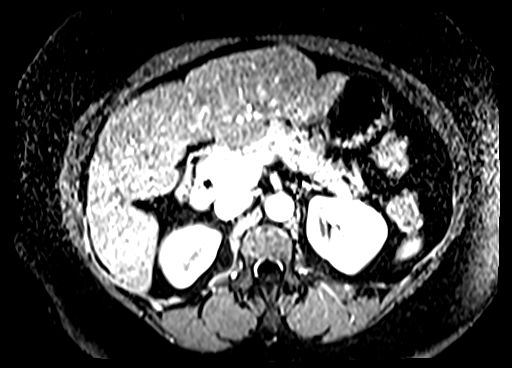
[im 85/85]
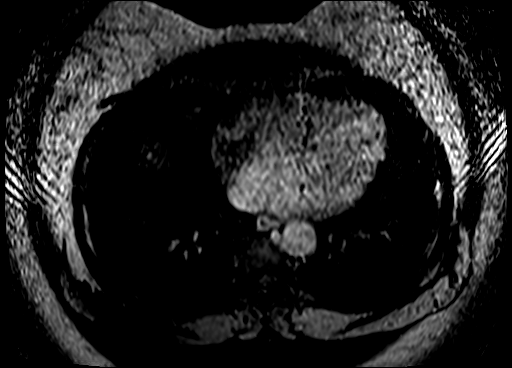

[Series 22: post 45 sec_sub · axial · 2.5mm · 0.70mm/px · z∈[-82,+136]mm · 3 of 88 slices shown]
[im 1/88]
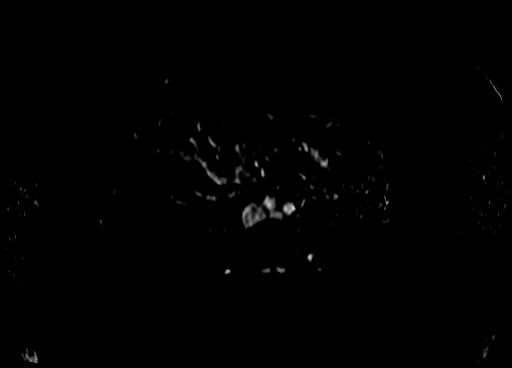
[im 44/88]
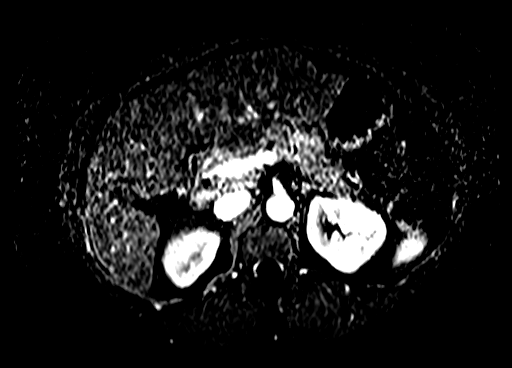
[im 88/88]
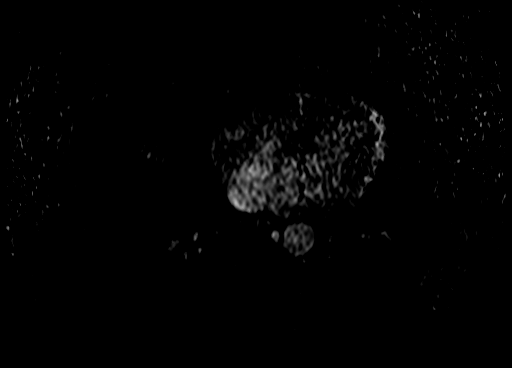

[Series 23: post 90 sec · axial · 2.5mm · 0.70mm/px · 1 of 88 slices shown]
[im 1/88]
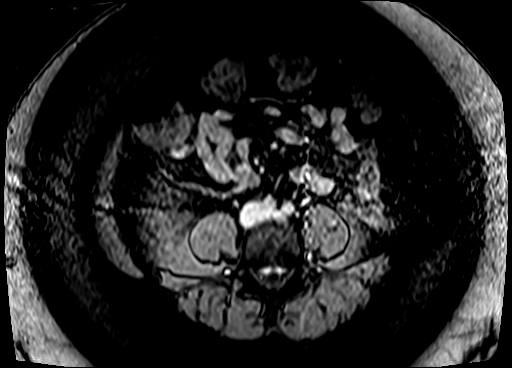

[32 of 48 positions shown; findings below may reference images not displayed]

FINDINGS: Lower chest: Unremarkable.

Hepatobiliary: Severe diffuse loss of signal intensity throughout
the hepatic parenchyma on out of phase dual echo images, indicative
of a background of severe hepatic steatosis. Again noted is a focal
area of architectural distortion with retraction of the liver
capsule involving the right lobe of the liver, predominantly in
segment 7, similar to the prior study, most compatible with a benign
area of scarring. Liver has a shrunken appearance and nodular
contour, concerning for underlying cirrhosis. No new suspicious
hepatic lesions are otherwise noted. No intra or extrahepatic
biliary ductal dilatation. Status post cholecystectomy. Common bile
duct measures 5 mm in the porta hepatis on MRCP images. No filling
defect in the common bile duct to suggest choledocholithiasis.

Pancreas: Tiny 4 mm T1 hypointense, T2 hyperintense, nonenhancing
lesion in the body of the pancreas, stable compared to the prior
study, strongly favored to represent a benign lesion. No other new
pancreatic mass. No pancreatic ductal dilatation noted on MRCP
images. No peripancreatic fluid collections or inflammatory changes.

Spleen:  Unremarkable.

Adrenals/Urinary Tract: In the lateral aspect of the interpolar
region of the left kidney there is a small lesion which follows fat
signal intensity on all pulse sequences, compatible with a small
angiomyolipoma measuring 7 mm (axial image 26 of series 12). Right
kidney and bilateral adrenal glands are normal in appearance. No
hydroureteronephrosis in the visualized portions of the abdomen.

Stomach/Bowel: Visualized portions are unremarkable.

Vascular/Lymphatic: No aneurysm identified in the visualized
abdominal vasculature. No lymphadenopathy noted in the abdomen.

Other: No significant volume of ascites noted in the visualized
portions of the peritoneal cavity.

Musculoskeletal: No aggressive appearing osseous lesions are noted
in the visualized portions of the skeleton.
IMPRESSION: 1. Stable size and appearance of small cystic lesion in the body of
the pancreas, which may represent a tiny pancreatic pseudocyst or
side branch IPMN (intraductal papillary mucinous neoplasm).
Stability is reassuring. Repeat abdominal MRI with and without IV
gadolinium with MRCP is recommended in 12 months to ensure continued
stability. This recommendation follows ACR consensus guidelines:
Management of Incidental Pancreatic Cysts: A White Paper of the ACR
Incidental Findings Committee. [HOSPITAL] [30];[DATE].
2. Severe hepatic steatosis.
3. Morphologic changes in the liver indicative of underlying
cirrhosis. Chronic scarring in the superior aspect of the right lobe
of the liver is stable.
4. Small left renal angiomyolipoma again noted.

## 2022-03-06 MED ORDER — GADOBENATE DIMEGLUMINE 529 MG/ML IV SOLN
20.0000 mL | Freq: Once | INTRAVENOUS | Status: AC | PRN
  Administered 2022-03-06: 20 mL via INTRAVENOUS

## 2022-08-01 ENCOUNTER — Other Ambulatory Visit: Payer: Self-pay | Admitting: Nurse Practitioner

## 2022-08-01 DIAGNOSIS — K7460 Unspecified cirrhosis of liver: Secondary | ICD-10-CM

## 2022-08-02 ENCOUNTER — Ambulatory Visit
Admission: RE | Admit: 2022-08-02 | Discharge: 2022-08-02 | Disposition: A | Discharge status: Home / Self Care | Payer: BC Managed Care – PPO | Source: Ambulatory Visit | Attending: Nurse Practitioner | Admitting: Nurse Practitioner

## 2022-08-02 DIAGNOSIS — K7460 Unspecified cirrhosis of liver: Secondary | ICD-10-CM

## 2023-02-23 ENCOUNTER — Other Ambulatory Visit: Payer: Self-pay | Admitting: Nurse Practitioner

## 2023-02-23 DIAGNOSIS — K7460 Unspecified cirrhosis of liver: Secondary | ICD-10-CM

## 2023-02-23 DIAGNOSIS — D490 Neoplasm of unspecified behavior of digestive system: Secondary | ICD-10-CM

## 2023-03-21 ENCOUNTER — Other Ambulatory Visit: Payer: BC Managed Care – PPO

## 2023-03-25 ENCOUNTER — Ambulatory Visit
Admission: RE | Admit: 2023-03-25 | Discharge: 2023-03-25 | Disposition: A | Discharge status: Home / Self Care | Payer: BC Managed Care – PPO | Source: Ambulatory Visit | Attending: Nurse Practitioner | Admitting: Nurse Practitioner

## 2023-03-25 DIAGNOSIS — D490 Neoplasm of unspecified behavior of digestive system: Secondary | ICD-10-CM

## 2023-03-25 DIAGNOSIS — K7460 Unspecified cirrhosis of liver: Secondary | ICD-10-CM

## 2023-03-25 MED ORDER — GADOPICLENOL 0.5 MMOL/ML IV SOLN
10.0000 mL | Freq: Once | INTRAVENOUS | Status: AC | PRN
  Administered 2023-03-25: 10 mL via INTRAVENOUS

## 2023-08-16 ENCOUNTER — Other Ambulatory Visit: Payer: Self-pay | Admitting: Nurse Practitioner

## 2023-08-16 DIAGNOSIS — K7460 Unspecified cirrhosis of liver: Secondary | ICD-10-CM

## 2023-08-22 ENCOUNTER — Other Ambulatory Visit: Payer: BC Managed Care – PPO

## 2023-08-29 ENCOUNTER — Ambulatory Visit
Admission: RE | Admit: 2023-08-29 | Discharge: 2023-08-29 | Disposition: A | Discharge status: Home / Self Care | Payer: BC Managed Care – PPO | Source: Ambulatory Visit | Attending: Nurse Practitioner | Admitting: Nurse Practitioner

## 2023-08-29 DIAGNOSIS — K7460 Unspecified cirrhosis of liver: Secondary | ICD-10-CM

## 2023-08-30 ENCOUNTER — Other Ambulatory Visit: Payer: Self-pay | Admitting: Nurse Practitioner

## 2023-08-30 DIAGNOSIS — D376 Neoplasm of uncertain behavior of liver, gallbladder and bile ducts: Secondary | ICD-10-CM

## 2023-10-08 ENCOUNTER — Ambulatory Visit
Admission: RE | Admit: 2023-10-08 | Discharge: 2023-10-08 | Disposition: A | Discharge status: Home / Self Care | Payer: BC Managed Care – PPO | Source: Ambulatory Visit | Attending: Nurse Practitioner | Admitting: Nurse Practitioner

## 2023-10-08 DIAGNOSIS — D376 Neoplasm of uncertain behavior of liver, gallbladder and bile ducts: Secondary | ICD-10-CM

## 2023-10-08 MED ORDER — GADOPICLENOL 0.5 MMOL/ML IV SOLN
10.0000 mL | Freq: Once | INTRAVENOUS | Status: AC | PRN
Start: 2023-10-08 — End: 2023-10-08
  Administered 2023-10-08: 10 mL via INTRAVENOUS

## 2024-02-19 ENCOUNTER — Other Ambulatory Visit: Payer: Self-pay | Admitting: Nurse Practitioner

## 2024-02-19 DIAGNOSIS — K7581 Nonalcoholic steatohepatitis (NASH): Secondary | ICD-10-CM

## 2024-02-23 ENCOUNTER — Encounter: Payer: Self-pay | Admitting: Nurse Practitioner

## 2024-02-27 ENCOUNTER — Other Ambulatory Visit

## 2024-03-05 ENCOUNTER — Ambulatory Visit
Admission: RE | Admit: 2024-03-05 | Discharge: 2024-03-05 | Disposition: A | Discharge status: Home / Self Care | Source: Ambulatory Visit | Attending: Nurse Practitioner

## 2024-03-05 DIAGNOSIS — K7581 Nonalcoholic steatohepatitis (NASH): Secondary | ICD-10-CM

## 2024-08-26 ENCOUNTER — Other Ambulatory Visit: Payer: Self-pay | Admitting: Nurse Practitioner

## 2024-08-26 DIAGNOSIS — K7460 Unspecified cirrhosis of liver: Secondary | ICD-10-CM

## 2024-09-02 ENCOUNTER — Ambulatory Visit
Admission: RE | Admit: 2024-09-02 | Discharge: 2024-09-02 | Disposition: A | Discharge status: Home / Self Care | Source: Ambulatory Visit | Attending: Nurse Practitioner | Admitting: Nurse Practitioner

## 2024-09-02 DIAGNOSIS — K7460 Unspecified cirrhosis of liver: Secondary | ICD-10-CM
